# Patient Record
Sex: Female | Born: 1990
Health system: Southern US, Community
[De-identification: ages and names within clinical notes are randomized; demographics above are authoritative.]

## PROBLEM LIST (undated history)

## (undated) ENCOUNTER — Inpatient Hospital Stay (HOSPITAL_COMMUNITY): Payer: Self-pay

## (undated) DIAGNOSIS — F329 Major depressive disorder, single episode, unspecified: Secondary | ICD-10-CM

## (undated) DIAGNOSIS — A419 Sepsis, unspecified organism: Secondary | ICD-10-CM

## (undated) DIAGNOSIS — F419 Anxiety disorder, unspecified: Secondary | ICD-10-CM

## (undated) DIAGNOSIS — F32A Depression, unspecified: Secondary | ICD-10-CM

## (undated) HISTORY — PX: BREAST ENHANCEMENT SURGERY: SHX7

## (undated) HISTORY — DX: Major depressive disorder, single episode, unspecified: F32.9

## (undated) HISTORY — DX: Depression, unspecified: F32.A

---
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
## 2009-02-22 ENCOUNTER — Other Ambulatory Visit: Admission: RE | Admit: 2009-02-22 | Discharge: 2009-02-22 | Payer: Self-pay | Admitting: Family Medicine

## 2010-03-13 ENCOUNTER — Other Ambulatory Visit: Admission: RE | Admit: 2010-03-13 | Discharge: 2010-03-13 | Payer: Self-pay | Admitting: Family Medicine

## 2011-05-18 DIAGNOSIS — R599 Enlarged lymph nodes, unspecified: Secondary | ICD-10-CM | POA: Insufficient documentation

## 2011-05-18 DIAGNOSIS — R0602 Shortness of breath: Secondary | ICD-10-CM | POA: Insufficient documentation

## 2011-05-18 DIAGNOSIS — J111 Influenza due to unidentified influenza virus with other respiratory manifestations: Secondary | ICD-10-CM | POA: Insufficient documentation

## 2011-05-19 ENCOUNTER — Emergency Department (HOSPITAL_COMMUNITY)
Admission: EM | Admit: 2011-05-19 | Discharge: 2011-05-19 | Disposition: A | Discharge status: Home / Self Care | Payer: BC Managed Care – PPO | Attending: Emergency Medicine | Admitting: Emergency Medicine

## 2011-05-19 ENCOUNTER — Encounter: Payer: Self-pay | Admitting: *Deleted

## 2011-05-19 ENCOUNTER — Emergency Department (HOSPITAL_COMMUNITY): Payer: BC Managed Care – PPO

## 2011-05-19 DIAGNOSIS — J029 Acute pharyngitis, unspecified: Secondary | ICD-10-CM

## 2011-05-19 HISTORY — DX: Sepsis, unspecified organism: A41.9

## 2011-05-19 LAB — RAPID STREP SCREEN (MED CTR MEBANE ONLY): Streptococcus, Group A Screen (Direct): NEGATIVE

## 2011-05-19 MED ORDER — HYDROCOD POLST-CHLORPHEN POLST 10-8 MG/5ML PO LQCR
5.0000 mL | Freq: Two times a day (BID) | ORAL | Status: DC
Start: 2011-05-19 — End: 2016-11-29

## 2011-05-19 MED ORDER — ACETAMINOPHEN-CODEINE 120-12 MG/5ML PO SOLN
5.0000 mL | Freq: Once | ORAL | Status: AC
  Administered 2011-05-19: 5 mL via ORAL
  Filled 2011-05-19: qty 10

## 2011-05-19 MED ORDER — DEXAMETHASONE SODIUM PHOSPHATE 10 MG/ML IJ SOLN
10.0000 mg | Freq: Once | INTRAMUSCULAR | Status: AC
  Administered 2011-05-19: 10 mg via INTRAMUSCULAR
  Filled 2011-05-19: qty 1

## 2011-05-19 MED ORDER — ALBUTEROL SULFATE HFA 108 (90 BASE) MCG/ACT IN AERS
2.0000 | INHALATION_SPRAY | Freq: Once | RESPIRATORY_TRACT | Status: AC
  Administered 2011-05-19: 2 via RESPIRATORY_TRACT
  Filled 2011-05-19: qty 6.7

## 2011-05-19 NOTE — ED Notes (Signed)
Pt c/o cough, sore throat, weakness, fatigue. Pt has been taking keflex that she had left over for her symptoms w/o relief.

## 2011-05-19 NOTE — ED Notes (Signed)
MD at bedside. 

## 2011-05-19 NOTE — ED Notes (Signed)
Pt given Rx . Mother at b/s

## 2011-05-19 NOTE — ED Provider Notes (Signed)
History     CSN: [id number] Arrival date & time: 05/19/2011 12:36 AM   First MD Initiated Contact with Patient 05/19/11 0159      Chief Complaint  Patient presents with  . Cough  . Fatigue  . Sore Throat    (Consider location/radiation/quality/duration/timing/severity/associated sxs/prior treatment) HPI Comments: Patient here with a week history of headache, cough, sore throat, weakness and fatigue - states that she intiiallly thought she had a cold, treated with OTC medications - reports no improvement in symptoms - states now with much worsening sore throat.  Patient is a 20 y.o. female presenting with cough and pharyngitis. The history is provided by the patient. No language interpreter was used.  Cough This is a new problem. The current episode started more than 1 week ago. The problem occurs constantly. The problem has not changed since onset.The cough is productive of sputum. There has been no fever. Associated symptoms include ear congestion, ear pain, headaches, rhinorrhea, sore throat and myalgias. Pertinent negatives include no chest pain, no chills, no weight loss, no shortness of breath and no wheezing. She has tried nothing for the symptoms. The treatment provided no relief. She is not a smoker.  Sore Throat Associated symptoms include coughing, headaches, myalgias and a sore throat. Pertinent negatives include no chest pain or chills.    Past Medical History  Diagnosis Date  . Sepsis     x 2 as a child    History reviewed. No pertinent past surgical history.  History reviewed. No pertinent family history.  History  Substance Use Topics  . Smoking status: Never Smoker   . Smokeless tobacco: Not on file  . Alcohol Use: Yes     occasionally    OB History    Grav Para Term Preterm Abortions TAB SAB Ect Mult Living                  Review of Systems  Constitutional: Negative for chills and weight loss.  HENT: Positive for ear pain, sore throat and  rhinorrhea.   Respiratory: Positive for cough. Negative for shortness of breath and wheezing.   Cardiovascular: Negative for chest pain.  Musculoskeletal: Positive for myalgias.  Neurological: Positive for headaches.  All other systems reviewed and are negative.    Allergies  Review of patient's allergies indicates no known allergies.  Home Medications  No current outpatient prescriptions on file.  BP 138/92  Pulse 99  Temp(Src) 98.6 F (37 C) (Oral)  Resp 18  SpO2 99%  LMP 05/11/2011  Physical Exam  Nursing note and vitals reviewed. Constitutional: She is oriented to person, place, and time. She appears well-developed and well-nourished. No distress.  HENT:  Head: Normocephalic and atraumatic.  Right Ear: External ear normal.  Left Ear: No drainage. Decreased hearing is noted.  Nose: Mucosal edema and rhinorrhea present.  Mouth/Throat: Uvula is midline and mucous membranes are normal. Posterior oropharyngeal erythema present. No oropharyngeal exudate or tonsillar abscesses.  Eyes: Conjunctivae are normal. Pupils are equal, round, and reactive to light.  Neck: Normal range of motion. Neck supple.  Cardiovascular: Normal rate, regular rhythm and normal heart sounds.  Exam reveals no gallop and no friction rub.   No murmur heard. Pulmonary/Chest: Effort normal and breath sounds normal. No respiratory distress. She has no wheezes. She exhibits no tenderness.  Abdominal: Soft. Bowel sounds are normal. There is no tenderness.  Musculoskeletal: Normal range of motion.  Lymphadenopathy:    She has cervical adenopathy.  Neurological: She  is alert and oriented to person, place, and time.  Skin: Skin is warm and dry. No rash noted.  Psychiatric: She has a normal mood and affect. Her behavior is normal. Judgment and thought content normal.    ED Course  Procedures (including critical care time)   Labs Reviewed  RAPID STREP SCREEN  MONONUCLEOSIS SCREEN   Dg Chest 2  View  05/19/2011  *RADIOLOGY REPORT*  Clinical Data: Shortness of breath with cough and sore throat for 3 days.  CHEST - 2 VIEW  Comparison: None.  Findings: The heart size and mediastinal contours are normal. The lungs are clear. There is no pleural effusion or pneumothorax. No acute osseous findings are identified.  IMPRESSION: No active cardiopulmonary process.  Original Report Authenticated By: Gerrianne Scale, M.D.     Viral pharyngitis   MDM  Mono screen and strep negative - given decadron 10mg  IM and tylenol with codeine - she reports no improvement in pain.        Izola Price [place], [place] 05/19/11 [phone]

## 2011-05-20 NOTE — ED Provider Notes (Signed)
Medical screening examination/treatment/procedure(s) were performed by non-physician practitioner and as supervising physician I was immediately available for consultation/collaboration.  Mourad Cwikla R. Marcianne Ozbun, MD 05/20/11 1947 

## 2011-07-21 ENCOUNTER — Other Ambulatory Visit: Payer: Self-pay | Admitting: Family Medicine

## 2011-07-21 ENCOUNTER — Other Ambulatory Visit (HOSPITAL_COMMUNITY)
Admission: RE | Admit: 2011-07-21 | Discharge: 2011-07-21 | Disposition: A | Discharge status: Home / Self Care | Payer: BC Managed Care – PPO | Source: Ambulatory Visit | Attending: Family Medicine | Admitting: Family Medicine

## 2011-07-21 DIAGNOSIS — Z113 Encounter for screening for infections with a predominantly sexual mode of transmission: Secondary | ICD-10-CM | POA: Insufficient documentation

## 2011-07-21 DIAGNOSIS — Z0000 Encounter for general adult medical examination without abnormal findings: Secondary | ICD-10-CM | POA: Insufficient documentation

## 2012-11-15 ENCOUNTER — Other Ambulatory Visit: Payer: Self-pay | Admitting: Physician Assistant

## 2012-11-15 ENCOUNTER — Other Ambulatory Visit (HOSPITAL_COMMUNITY)
Admission: RE | Admit: 2012-11-15 | Discharge: 2012-11-15 | Disposition: A | Discharge status: Home / Self Care | Payer: BC Managed Care – PPO | Source: Ambulatory Visit | Attending: Family Medicine | Admitting: Family Medicine

## 2012-11-15 DIAGNOSIS — Z0000 Encounter for general adult medical examination without abnormal findings: Secondary | ICD-10-CM | POA: Insufficient documentation

## 2012-11-15 DIAGNOSIS — Z113 Encounter for screening for infections with a predominantly sexual mode of transmission: Secondary | ICD-10-CM | POA: Insufficient documentation

## 2016-11-28 ENCOUNTER — Inpatient Hospital Stay (HOSPITAL_COMMUNITY)
Admission: AD | Admit: 2016-11-28 | Discharge: 2016-11-29 | Disposition: A | Discharge status: Home / Self Care | Payer: 59 | Source: Ambulatory Visit | Attending: Obstetrics and Gynecology | Admitting: Obstetrics and Gynecology

## 2016-11-28 ENCOUNTER — Encounter (HOSPITAL_COMMUNITY): Payer: Self-pay | Admitting: *Deleted

## 2016-11-28 DIAGNOSIS — O26891 Other specified pregnancy related conditions, first trimester: Secondary | ICD-10-CM | POA: Diagnosis not present

## 2016-11-28 DIAGNOSIS — R109 Unspecified abdominal pain: Secondary | ICD-10-CM | POA: Diagnosis not present

## 2016-11-28 DIAGNOSIS — Z3A01 Less than 8 weeks gestation of pregnancy: Secondary | ICD-10-CM | POA: Insufficient documentation

## 2016-11-28 DIAGNOSIS — O3680X0 Pregnancy with inconclusive fetal viability, not applicable or unspecified: Secondary | ICD-10-CM

## 2016-11-28 DIAGNOSIS — O209 Hemorrhage in early pregnancy, unspecified: Secondary | ICD-10-CM | POA: Diagnosis present

## 2016-11-28 DIAGNOSIS — O9989 Other specified diseases and conditions complicating pregnancy, childbirth and the puerperium: Secondary | ICD-10-CM | POA: Diagnosis not present

## 2016-11-28 LAB — POCT PREGNANCY, URINE: PREG TEST UR: POSITIVE — AB

## 2016-11-28 NOTE — MAU Note (Signed)
LMp 10/12/16. Had positive upt about 3wks ago. Have first appt with Dr Elon SpannerLeger on 6/28. About 1600 had some cramping. Tonight went to BR and saw dark blood on panties. Cont to see dark blood on panties when I wipe.

## 2016-11-29 ENCOUNTER — Encounter (HOSPITAL_COMMUNITY): Payer: Self-pay | Admitting: Certified Nurse Midwife

## 2016-11-29 ENCOUNTER — Inpatient Hospital Stay (HOSPITAL_COMMUNITY): Payer: 59

## 2016-11-29 DIAGNOSIS — O9989 Other specified diseases and conditions complicating pregnancy, childbirth and the puerperium: Secondary | ICD-10-CM | POA: Diagnosis not present

## 2016-11-29 DIAGNOSIS — R109 Unspecified abdominal pain: Secondary | ICD-10-CM

## 2016-11-29 LAB — CBC
HCT: 37.8 % (ref 36.0–46.0)
Hemoglobin: 13.3 g/dL (ref 12.0–15.0)
MCH: 32.8 pg (ref 26.0–34.0)
MCHC: 35.2 g/dL (ref 30.0–36.0)
MCV: 93.3 fL (ref 78.0–100.0)
PLATELETS: 204 10*3/uL (ref 150–400)
RBC: 4.05 MIL/uL (ref 3.87–5.11)
RDW: 13 % (ref 11.5–15.5)
WBC: 9.8 10*3/uL (ref 4.0–10.5)

## 2016-11-29 LAB — WET PREP, GENITAL
Sperm: NONE SEEN
TRICH WET PREP: NONE SEEN
Yeast Wet Prep HPF POC: NONE SEEN

## 2016-11-29 LAB — URINALYSIS, ROUTINE W REFLEX MICROSCOPIC
Bilirubin Urine: NEGATIVE
Glucose, UA: NEGATIVE mg/dL
KETONES UR: NEGATIVE mg/dL
LEUKOCYTES UA: NEGATIVE
NITRITE: NEGATIVE
PROTEIN: NEGATIVE mg/dL
Specific Gravity, Urine: 1.01 (ref 1.005–1.030)
pH: 5.5 (ref 5.0–8.0)

## 2016-11-29 LAB — URINALYSIS, MICROSCOPIC (REFLEX)

## 2016-11-29 LAB — HCG, QUANTITATIVE, PREGNANCY: hCG, Beta Chain, Quant, S: 391 m[IU]/mL — ABNORMAL HIGH (ref <5)

## 2016-11-29 LAB — ABO/RH: ABO/RH(D): O POS

## 2016-11-29 LAB — HIV ANTIBODY (ROUTINE TESTING W REFLEX): HIV SCREEN 4TH GENERATION: NONREACTIVE

## 2016-11-29 NOTE — MAU Provider Note (Signed)
Chief Complaint: Vaginal Bleeding   First Provider Initiated Contact with Patient 11/29/16 0151      SUBJECTIVE HPI: Brooke Bailey is a 26 y.o. G1P0 at [redacted]w[redacted]d by LMP who presents to maternity admissions reporting onset of cramping and vaginal bleeding today. She reports she worked in the yard, then vacuumed the house, and then started having low abdominal intermittent cramping like period cramps. She thought she may have overdone it today and just needed to rest, but then she saw dark red bleeding when wiping and some on the pad she put on before arriving in MAU. The bleeding is like a light period and is unchanged since onset.  She has not tried any treatments for pain or bleeding, nothing makes them better or worse.  There are no associated symptoms.   She denies vaginal itching/burning, urinary symptoms, h/a, dizziness, n/v, or fever/chills.     HPI  Past Medical History:  Diagnosis Date  . Sepsis(995.91)    x 2 as a child   History reviewed. No pertinent surgical history. Social History   Social History  . Marital status: Married    Spouse name: N/A  . Number of children: N/A  . Years of education: N/A   Occupational History  . Not on file.   Social History Main Topics  . Smoking status: Never Smoker  . Smokeless tobacco: Not on file  . Alcohol use Yes     Comment: occasionally  . Drug use: No  . Sexual activity: Yes    Birth control/ protection: Pill   Other Topics Concern  . Not on file   Social History Narrative  . No narrative on file   No current facility-administered medications on file prior to encounter.    No current outpatient prescriptions on file prior to encounter.   No Known Allergies  ROS:  Review of Systems  Constitutional: Negative for chills, fatigue and fever.  Respiratory: Negative for shortness of breath.   Cardiovascular: Negative for chest pain.  Gastrointestinal: Negative for nausea and vomiting.  Genitourinary: Positive for pelvic  pain and vaginal bleeding. Negative for difficulty urinating, dysuria, flank pain, vaginal discharge and vaginal pain.  Neurological: Negative for dizziness and headaches.  Psychiatric/Behavioral: Negative.      I have reviewed patient's Past Medical Hx, Surgical Hx, Family Hx, Social Hx, medications and allergies.   Physical Exam   Patient Vitals for the past 24 hrs:  BP Temp Temp src Pulse Resp Height Weight  11/29/16 0406 129/75 98.6 F (37 C) Oral 92 16 - -  11/29/16 0129 (!) 144/90 98.2 F (36.8 C) Oral (!) 104 16 - -  11/28/16 2312 (!) 151/87 98.3 F (36.8 C) - (!) 106 18 5\' 8"  (1.727 m) 170 lb (77.1 kg)   Constitutional: Well-developed, well-nourished female in no acute distress.  Cardiovascular: normal rate Respiratory: normal effort GI: Abd soft, non-tender. Pos BS x 4 MS: Extremities nontender, no edema, normal ROM Neurologic: Alert and oriented x 4.  GU: Neg CVAT.  PELVIC EXAM: Cervix pink, visually closed, without lesion, small amount dark red bleeding cleared from vagina with 1 large cotton swab, vaginal walls and external genitalia normal Bimanual exam: Cervix 0/long/high, firm, anterior, neg CMT, uterus nontender, slightly enlarged, adnexa without tenderness, enlargement, or mass   LAB RESULTS Results for orders placed or performed during the hospital encounter of 11/28/16 (from the past 24 hour(s))  Pregnancy, urine POC     Status: Abnormal   Collection Time: 11/28/16 11:27 PM  Result Value Ref Range   Preg Test, Ur POSITIVE (A) NEGATIVE  Urinalysis, Routine w reflex microscopic     Status: Abnormal   Collection Time: 11/29/16  1:20 AM  Result Value Ref Range   Color, Urine YELLOW YELLOW   APPearance CLEAR CLEAR   Specific Gravity, Urine 1.010 1.005 - 1.030   pH 5.5 5.0 - 8.0   Glucose, UA NEGATIVE NEGATIVE mg/dL   Hgb urine dipstick MODERATE (A) NEGATIVE   Bilirubin Urine NEGATIVE NEGATIVE   Ketones, ur NEGATIVE NEGATIVE mg/dL   Protein, ur NEGATIVE  NEGATIVE mg/dL   Nitrite NEGATIVE NEGATIVE   Leukocytes, UA NEGATIVE NEGATIVE  Urinalysis, Microscopic (reflex)     Status: Abnormal   Collection Time: 11/29/16  1:20 AM  Result Value Ref Range   RBC / HPF 0-5 0 - 5 RBC/hpf   WBC, UA 0-5 0 - 5 WBC/hpf   Bacteria, UA FEW (A) NONE SEEN   Squamous Epithelial / LPF 0-5 (A) NONE SEEN  CBC     Status: None   Collection Time: 11/29/16  1:31 AM  Result Value Ref Range   WBC 9.8 4.0 - 10.5 K/uL   RBC 4.05 3.87 - 5.11 MIL/uL   Hemoglobin 13.3 12.0 - 15.0 g/dL   HCT 45.437.8 09.836.0 - 11.946.0 %   MCV 93.3 78.0 - 100.0 fL   MCH 32.8 26.0 - 34.0 pg   MCHC 35.2 30.0 - 36.0 g/dL   RDW 14.713.0 82.911.5 - 56.215.5 %   Platelets 204 150 - 400 K/uL  hCG, quantitative, pregnancy     Status: Abnormal   Collection Time: 11/29/16  1:31 AM  Result Value Ref Range   hCG, Beta Chain, Quant, S 391 (H) <5 mIU/mL  HIV antibody     Status: None   Collection Time: 11/29/16  1:31 AM  Result Value Ref Range   HIV Screen 4th Generation wRfx Non Reactive Non Reactive  ABO/Rh     Status: None   Collection Time: 11/29/16  1:31 AM  Result Value Ref Range   ABO/RH(D) O POS   Wet prep, genital     Status: Abnormal   Collection Time: 11/29/16  2:00 AM  Result Value Ref Range   Yeast Wet Prep HPF POC NONE SEEN NONE SEEN   Trich, Wet Prep NONE SEEN NONE SEEN   Clue Cells Wet Prep HPF POC PRESENT (A) NONE SEEN   WBC, Wet Prep HPF POC MODERATE (A) NONE SEEN   Sperm NONE SEEN     --/--/O POS (06/16 0131)  IMAGING [place] Ob Comp Less 14 Wks  Result Date: 11/29/2016 CLINICAL DATA:  Cramping and bleeding EXAM: OBSTETRIC <14 WK US AND TRANSVAGINAL OB US TECHNIQUE: Both transabdominal and transvaginal ultrasound examinations were performed for complete evaluation of the gestation as well as the maternal uterus, adnexal regions, and pelvic cul-de-sac. Transvaginal technique was performed to assess early pregnancy. COMPARISON:  None. FINDINGS: Intrauterine gestational sac: Probable intrauterine  gestational sac Yolk sac:  Possible small yolk sac Embryo:  Not visualized MSD: 3.4  mm   5 w   0  d Subchorionic hemorrhage:  None visualized. Maternal uterus/adnexae: Ovaries are within normal limits. The right ovary measures 2.6 by 3 x 1.6 cm and the left ovary measures 3.1 by 3.1 x 2 cm. Trace free fluid. IMPRESSION: Probable early intrauterine gestational sac, possible small yolk sac, but no fetal pole, or cardiac activity yet visualized. Recommend follow-up quantitative B-HCG levels and follow-up US in 14 days  to assess viability. This recommendation follows SRU consensus guidelines: Diagnostic Criteria for Nonviable Pregnancy Early in the First Trimester. Malva Limes Med 2013; 191:4782-95. Trace free fluid Electronically Signed   By: Jasmine Pang M.D.   On: 11/29/2016 03:18   US Ob Transvaginal  Result Date: 11/29/2016 CLINICAL DATA:  Cramping and bleeding EXAM: OBSTETRIC <14 WK [place] AND TRANSVAGINAL OB US TECHNIQUE: Both transabdominal and transvaginal ultrasound examinations were performed for complete evaluation of the gestation as well as the maternal uterus, adnexal regions, and pelvic cul-de-sac. Transvaginal technique was performed to assess early pregnancy. COMPARISON:  None. FINDINGS: Intrauterine gestational sac: Probable intrauterine gestational sac Yolk sac:  Possible small yolk sac Embryo:  Not visualized MSD: 3.4  mm   5 w   0  d Subchorionic hemorrhage:  None visualized. Maternal uterus/adnexae: Ovaries are within normal limits. The right ovary measures 2.6 by 3 x 1.6 cm and the left ovary measures 3.1 by 3.1 x 2 cm. Trace free fluid. IMPRESSION: Probable early intrauterine gestational sac, possible small yolk sac, but no fetal pole, or cardiac activity yet visualized. Recommend follow-up quantitative B-HCG levels and follow-up US in 14 days to assess viability. This recommendation follows SRU consensus guidelines: Diagnostic Criteria for Nonviable Pregnancy Early in the First Trimester. Malva Limes Med 2013; 621:3086-57. Trace free fluid Electronically Signed   By: Jasmine Pang M.D.   On: 11/29/2016 03:18    MAU Management/MDM: Ordered labs and [place] and reviewed results. Consult Dr Marcelle Overlie with assessment and findings. Findings today could represent a normal early pregnancy, spontaneous abortion or ectopic pregnancy which can be life-threatening.  Ectopic precautions were given to the patient with plan to return in 48 hours for repeat quant hcg to evaluate pregnancy development. Pt stable at time of discharge.  ASSESSMENT 1. Pregnancy of unknown anatomic location   2. Vaginal bleeding in pregnancy, first trimester   3. Abdominal pain during pregnancy in first trimester     PLAN Discharge home Return on Sunday evening for repeat quant hcg or sooner as needed for emergencies  Allergies as of 11/29/2016   No Known Allergies     Medication List    STOP taking these medications   chlorpheniramine-HYDROcodone 10-8 MG/5ML Lqcr Commonly known as:  TUSSIONEX PENNKINETIC ER     TAKE these medications   citalopram 20 MG tablet Commonly known as:  CELEXA Take 20 mg by mouth daily.      Follow-up Information    [facility] [place] Follow up.   Why:  Return to MAU on Sunday, 11/30/16, after 5 pm for repeat labwork or return sooner as needed for emergencies. Contact information: [address] [place] [phone]          Sharen Counter Certified Nurse-Midwife 11/29/2016  8:14 PM

## 2016-11-30 ENCOUNTER — Encounter (HOSPITAL_COMMUNITY): Payer: Self-pay | Admitting: *Deleted

## 2016-11-30 ENCOUNTER — Inpatient Hospital Stay (HOSPITAL_COMMUNITY)
Admission: AD | Admit: 2016-11-30 | Discharge: 2016-11-30 | Disposition: A | Discharge status: Home / Self Care | Payer: 59 | Source: Ambulatory Visit | Attending: Obstetrics and Gynecology | Admitting: Obstetrics and Gynecology

## 2016-11-30 DIAGNOSIS — Z3A01 Less than 8 weeks gestation of pregnancy: Secondary | ICD-10-CM | POA: Insufficient documentation

## 2016-11-30 DIAGNOSIS — O039 Complete or unspecified spontaneous abortion without complication: Secondary | ICD-10-CM | POA: Insufficient documentation

## 2016-11-30 HISTORY — DX: Anxiety disorder, unspecified: F41.9

## 2016-11-30 LAB — HCG, QUANTITATIVE, PREGNANCY: hCG, Beta Chain, Quant, S: 152 m[IU]/mL — ABNORMAL HIGH (ref <5)

## 2016-11-30 NOTE — MAU Provider Note (Signed)
S:  [name] is a 26 y.o. female G1P0 @ 469w0d here in MAU for a follow up Quant. She was seen 2 days ago with vaginal bleeding and pain, here in MAU. Over the weekend she continued to have bleeding and pain and had an episode of increased pain/bleeding and then it stopped. Currently she is without pain, the bleeding is light.    O:  GENERAL: Well-developed, well-nourished female in no acute distress.  LUNGS: Effort normal SKIN: Warm, dry and without erythema PSYCH: Normal mood and affect  Vitals:   11/30/16 1836 11/30/16 1856  BP: (!) 155/82 133/82  Pulse: (!) 107 98  Resp: 16 16  Temp: 98 F (36.7 C)   TempSrc: Oral     MDM:  Quant 6/15: 391 Quant 6/17: 152  O positive blood type Discussed patient with Dr. Marcelle OverlieHolland.    A:  1. SAB (spontaneous abortion)      P:  Discharge home in stable condition Patient to return to MAU if symptoms worsen Patient to call the office tomorrow to schedule a follow up quant Support given.     Duane Lopeasch, Analilia Geddis I, NP 11/30/2016 6:23 PM

## 2016-11-30 NOTE — Discharge Instructions (Signed)

## 2016-11-30 NOTE — MAU Note (Signed)
Pt was here two days ago with vag bleeding and cramping that continued throughout the weekend, noticed mucous and clots this morning, after that the cramping slowed as well as the bleeding. Pt is pain free now. Here for a repeat HCG.

## 2016-12-01 LAB — GC/CHLAMYDIA PROBE AMP (~~LOC~~) NOT AT ARMC
Chlamydia: NEGATIVE
Neisseria Gonorrhea: NEGATIVE

## 2017-01-14 DIAGNOSIS — Z01419 Encounter for gynecological examination (general) (routine) without abnormal findings: Secondary | ICD-10-CM | POA: Diagnosis not present

## 2017-05-25 DIAGNOSIS — R0600 Dyspnea, unspecified: Secondary | ICD-10-CM | POA: Diagnosis not present

## 2017-05-25 DIAGNOSIS — J984 Other disorders of lung: Secondary | ICD-10-CM | POA: Diagnosis not present

## 2017-05-25 DIAGNOSIS — R05 Cough: Secondary | ICD-10-CM | POA: Diagnosis not present

## 2017-05-25 DIAGNOSIS — R918 Other nonspecific abnormal finding of lung field: Secondary | ICD-10-CM | POA: Diagnosis not present

## 2017-05-25 DIAGNOSIS — R002 Palpitations: Secondary | ICD-10-CM | POA: Diagnosis not present

## 2017-05-25 DIAGNOSIS — Z79899 Other long term (current) drug therapy: Secondary | ICD-10-CM | POA: Diagnosis not present

## 2017-05-25 DIAGNOSIS — M791 Myalgia, unspecified site: Secondary | ICD-10-CM | POA: Diagnosis not present

## 2017-05-25 DIAGNOSIS — J181 Lobar pneumonia, unspecified organism: Secondary | ICD-10-CM | POA: Diagnosis not present

## 2017-05-25 DIAGNOSIS — J168 Pneumonia due to other specified infectious organisms: Secondary | ICD-10-CM | POA: Diagnosis not present

## 2017-06-16 NOTE — L&D Delivery Note (Signed)
Delivery Note At 2:06 PM a viable female was delivered via  (Presentation: LOA). APGAR: 8, 9; weight pending.  Placenta status: S, I. 3V Cord with the following complications: none.  Cord pH: n/a  Anesthesia:  CLEA Episiotomy:  n/a Lacerations:  2nd degree Suture Repair: 3.0 vicryl rapide Est. Blood Loss (mL):  350  Mom to postpartum.  Baby to Couplet care / Skin to Skin.  Brooke Bailey 06/04/2018, 2:36 PM

## 2017-10-05 DIAGNOSIS — N912 Amenorrhea, unspecified: Secondary | ICD-10-CM | POA: Diagnosis not present

## 2017-10-07 DIAGNOSIS — N912 Amenorrhea, unspecified: Secondary | ICD-10-CM | POA: Diagnosis not present

## 2017-10-11 ENCOUNTER — Encounter (HOSPITAL_COMMUNITY): Payer: Self-pay

## 2017-10-20 DIAGNOSIS — N911 Secondary amenorrhea: Secondary | ICD-10-CM | POA: Diagnosis not present

## 2017-10-28 DIAGNOSIS — Z3685 Encounter for antenatal screening for Streptococcus B: Secondary | ICD-10-CM | POA: Diagnosis not present

## 2017-10-28 DIAGNOSIS — Z3401 Encounter for supervision of normal first pregnancy, first trimester: Secondary | ICD-10-CM | POA: Diagnosis not present

## 2017-10-28 DIAGNOSIS — Z13228 Encounter for screening for other metabolic disorders: Secondary | ICD-10-CM | POA: Diagnosis not present

## 2017-10-28 LAB — OB RESULTS CONSOLE HIV ANTIBODY (ROUTINE TESTING): HIV: NONREACTIVE

## 2017-10-28 LAB — OB RESULTS CONSOLE ANTIBODY SCREEN: ANTIBODY SCREEN: NEGATIVE

## 2017-10-28 LAB — OB RESULTS CONSOLE GC/CHLAMYDIA
CHLAMYDIA, DNA PROBE: NEGATIVE
GC PROBE AMP, GENITAL: NEGATIVE

## 2017-10-28 LAB — OB RESULTS CONSOLE ABO/RH: RH Type: POSITIVE

## 2017-10-28 LAB — OB RESULTS CONSOLE RUBELLA ANTIBODY, IGM: Rubella: IMMUNE

## 2017-10-28 LAB — OB RESULTS CONSOLE HEPATITIS B SURFACE ANTIGEN: HEP B S AG: NEGATIVE

## 2017-10-28 LAB — OB RESULTS CONSOLE RPR: RPR: NONREACTIVE

## 2017-12-01 DIAGNOSIS — Z3A12 12 weeks gestation of pregnancy: Secondary | ICD-10-CM | POA: Diagnosis not present

## 2018-01-19 DIAGNOSIS — Z3480 Encounter for supervision of other normal pregnancy, unspecified trimester: Secondary | ICD-10-CM | POA: Diagnosis not present

## 2018-01-19 DIAGNOSIS — Z3A19 19 weeks gestation of pregnancy: Secondary | ICD-10-CM | POA: Diagnosis not present

## 2018-03-10 DIAGNOSIS — Z3480 Encounter for supervision of other normal pregnancy, unspecified trimester: Secondary | ICD-10-CM | POA: Diagnosis not present

## 2018-03-24 DIAGNOSIS — D509 Iron deficiency anemia, unspecified: Secondary | ICD-10-CM | POA: Diagnosis not present

## 2018-03-26 ENCOUNTER — Other Ambulatory Visit (HOSPITAL_COMMUNITY): Payer: Self-pay

## 2018-03-29 ENCOUNTER — Ambulatory Visit (HOSPITAL_COMMUNITY)
Admission: RE | Admit: 2018-03-29 | Discharge: 2018-03-29 | Disposition: A | Discharge status: Home / Self Care | Payer: 59 | Source: Ambulatory Visit | Attending: Obstetrics and Gynecology | Admitting: Obstetrics and Gynecology

## 2018-03-29 DIAGNOSIS — O99019 Anemia complicating pregnancy, unspecified trimester: Secondary | ICD-10-CM | POA: Insufficient documentation

## 2018-03-29 DIAGNOSIS — Z3A00 Weeks of gestation of pregnancy not specified: Secondary | ICD-10-CM | POA: Diagnosis not present

## 2018-03-29 DIAGNOSIS — D509 Iron deficiency anemia, unspecified: Secondary | ICD-10-CM | POA: Diagnosis not present

## 2018-03-29 MED ORDER — SODIUM CHLORIDE 0.9 % IV SOLN
510.0000 mg | INTRAVENOUS | Status: DC
  Administered 2018-03-29: 510 mg via INTRAVENOUS
  Filled 2018-03-29: qty 17

## 2018-03-29 NOTE — Discharge Instructions (Signed)

## 2018-04-01 ENCOUNTER — Encounter (HOSPITAL_COMMUNITY)
Admission: RE | Admit: 2018-04-01 | Discharge: 2018-04-01 | Disposition: A | Discharge status: Home / Self Care | Payer: 59 | Source: Ambulatory Visit | Attending: Obstetrics and Gynecology | Admitting: Obstetrics and Gynecology

## 2018-04-01 DIAGNOSIS — D509 Iron deficiency anemia, unspecified: Secondary | ICD-10-CM | POA: Diagnosis present

## 2018-04-01 MED ORDER — SODIUM CHLORIDE 0.9 % IV SOLN
510.0000 mg | INTRAVENOUS | Status: DC
  Administered 2018-04-01: 510 mg via INTRAVENOUS
  Filled 2018-04-01: qty 17

## 2018-04-01 MED ORDER — SODIUM CHLORIDE 0.9 % IV SOLN
510.0000 mg | INTRAVENOUS | Status: DC
  Filled 2018-04-01: qty 17

## 2018-04-27 DIAGNOSIS — Z3482 Encounter for supervision of other normal pregnancy, second trimester: Secondary | ICD-10-CM | POA: Diagnosis not present

## 2018-04-27 DIAGNOSIS — Z3483 Encounter for supervision of other normal pregnancy, third trimester: Secondary | ICD-10-CM | POA: Diagnosis not present

## 2018-05-06 DIAGNOSIS — Z3480 Encounter for supervision of other normal pregnancy, unspecified trimester: Secondary | ICD-10-CM | POA: Diagnosis not present

## 2018-05-06 LAB — OB RESULTS CONSOLE GBS: GBS: NEGATIVE

## 2018-05-25 ENCOUNTER — Encounter (HOSPITAL_COMMUNITY): Payer: Self-pay | Admitting: *Deleted

## 2018-05-25 ENCOUNTER — Telehealth (HOSPITAL_COMMUNITY): Payer: Self-pay | Admitting: *Deleted

## 2018-05-25 NOTE — Telephone Encounter (Signed)
Preadmission screen  

## 2018-06-03 ENCOUNTER — Encounter (HOSPITAL_COMMUNITY): Payer: Self-pay

## 2018-06-03 ENCOUNTER — Other Ambulatory Visit: Payer: Self-pay

## 2018-06-03 ENCOUNTER — Inpatient Hospital Stay (HOSPITAL_COMMUNITY)
Admission: RE | Admit: 2018-06-03 | Discharge: 2018-06-05 | DRG: 807 | Disposition: A | Discharge status: Home / Self Care | Payer: 59 | Attending: Obstetrics & Gynecology | Admitting: Obstetrics & Gynecology

## 2018-06-03 DIAGNOSIS — Z3A39 39 weeks gestation of pregnancy: Secondary | ICD-10-CM

## 2018-06-03 DIAGNOSIS — Z3490 Encounter for supervision of normal pregnancy, unspecified, unspecified trimester: Secondary | ICD-10-CM

## 2018-06-03 DIAGNOSIS — Z3483 Encounter for supervision of other normal pregnancy, third trimester: Secondary | ICD-10-CM | POA: Diagnosis not present

## 2018-06-03 LAB — CBC
HCT: 36.1 % (ref 36.0–46.0)
Hemoglobin: 12.7 g/dL (ref 12.0–15.0)
MCH: 33.8 pg (ref 26.0–34.0)
MCHC: 35.2 g/dL (ref 30.0–36.0)
MCV: 96 fL (ref 80.0–100.0)
Platelets: 167 10*3/uL (ref 150–400)
RBC: 3.76 MIL/uL — AB (ref 3.87–5.11)
RDW: 14.1 % (ref 11.5–15.5)
WBC: 9.1 10*3/uL (ref 4.0–10.5)
nRBC: 0 % (ref 0.0–0.2)

## 2018-06-03 LAB — RPR: RPR Ser Ql: NONREACTIVE

## 2018-06-03 LAB — TYPE AND SCREEN
ABO/RH(D): O POS
Antibody Screen: NEGATIVE

## 2018-06-03 MED ORDER — TERBUTALINE SULFATE 1 MG/ML IJ SOLN
0.2500 mg | Freq: Once | INTRAMUSCULAR | Status: DC | PRN
  Filled 2018-06-03: qty 1

## 2018-06-03 MED ORDER — PHENYLEPHRINE 40 MCG/ML (10ML) SYRINGE FOR IV PUSH (FOR BLOOD PRESSURE SUPPORT)
80.0000 ug | PREFILLED_SYRINGE | INTRAVENOUS | Status: DC | PRN
  Filled 2018-06-03 (×2): qty 10

## 2018-06-03 MED ORDER — BUTORPHANOL TARTRATE 2 MG/ML IJ SOLN
2.0000 mg | INTRAMUSCULAR | Status: DC | PRN
  Administered 2018-06-03 – 2018-06-04 (×4): 2 mg via INTRAVENOUS
  Filled 2018-06-03 (×4): qty 2

## 2018-06-03 MED ORDER — OXYCODONE-ACETAMINOPHEN 5-325 MG PO TABS: 1.0000 | ORAL_TABLET | ORAL | Status: DC | PRN

## 2018-06-03 MED ORDER — FENTANYL 2.5 MCG/ML BUPIVACAINE 1/10 % EPIDURAL INFUSION (WH - ANES)
14.0000 mL/h | INTRAMUSCULAR | Status: DC | PRN
  Administered 2018-06-04: 14 mL/h via EPIDURAL
  Filled 2018-06-03 (×2): qty 100

## 2018-06-03 MED ORDER — LACTATED RINGERS IV SOLN
INTRAVENOUS | Status: DC
  Administered 2018-06-03 – 2018-06-04 (×5): via INTRAVENOUS

## 2018-06-03 MED ORDER — EPHEDRINE 5 MG/ML INJ
10.0000 mg | INTRAVENOUS | Status: DC | PRN
  Filled 2018-06-03: qty 2

## 2018-06-03 MED ORDER — LIDOCAINE HCL (PF) 1 % IJ SOLN
30.0000 mL | INTRAMUSCULAR | Status: DC | PRN
  Filled 2018-06-03: qty 30

## 2018-06-03 MED ORDER — OXYCODONE-ACETAMINOPHEN 5-325 MG PO TABS: 2.0000 | ORAL_TABLET | ORAL | Status: DC | PRN

## 2018-06-03 MED ORDER — OXYTOCIN 40 UNITS IN LACTATED RINGERS INFUSION - SIMPLE MED: 1.0000 m[IU]/min | INTRAVENOUS | Status: DC

## 2018-06-03 MED ORDER — ONDANSETRON HCL 4 MG/2ML IJ SOLN: 4.0000 mg | Freq: Four times a day (QID) | INTRAMUSCULAR | Status: DC | PRN

## 2018-06-03 MED ORDER — FLEET ENEMA 7-19 GM/118ML RE ENEM: 1.0000 | ENEMA | RECTAL | Status: DC | PRN

## 2018-06-03 MED ORDER — LACTATED RINGERS IV SOLN
500.0000 mL | Freq: Once | INTRAVENOUS | Status: AC
  Administered 2018-06-04: 500 mL via INTRAVENOUS

## 2018-06-03 MED ORDER — LACTATED RINGERS IV SOLN: 500.0000 mL | INTRAVENOUS | Status: DC | PRN

## 2018-06-03 MED ORDER — OXYTOCIN 40 UNITS IN LACTATED RINGERS INFUSION - SIMPLE MED
2.5000 [IU]/h | INTRAVENOUS | Status: DC
  Administered 2018-06-04: 2.5 [IU]/h via INTRAVENOUS
  Filled 2018-06-03: qty 1000

## 2018-06-03 MED ORDER — OXYTOCIN 40 UNITS IN LACTATED RINGERS INFUSION - SIMPLE MED
1.0000 m[IU]/min | INTRAVENOUS | Status: DC
  Administered 2018-06-03: 1 m[IU]/min via INTRAVENOUS

## 2018-06-03 MED ORDER — OXYTOCIN BOLUS FROM INFUSION
500.0000 mL | Freq: Once | INTRAVENOUS | Status: AC
  Administered 2018-06-04: 500 mL via INTRAVENOUS

## 2018-06-03 MED ORDER — PHENYLEPHRINE 40 MCG/ML (10ML) SYRINGE FOR IV PUSH (FOR BLOOD PRESSURE SUPPORT)
80.0000 ug | PREFILLED_SYRINGE | INTRAVENOUS | Status: DC | PRN
  Filled 2018-06-03: qty 10

## 2018-06-03 MED ORDER — ACETAMINOPHEN 325 MG PO TABS: 650.0000 mg | ORAL_TABLET | ORAL | Status: DC | PRN

## 2018-06-03 MED ORDER — MISOPROSTOL 25 MCG QUARTER TABLET
25.0000 ug | ORAL_TABLET | ORAL | Status: DC | PRN
  Administered 2018-06-03 (×3): 25 ug via VAGINAL
  Filled 2018-06-03 (×4): qty 1

## 2018-06-03 MED ORDER — SOD CITRATE-CITRIC ACID 500-334 MG/5ML PO SOLN: 30.0000 mL | ORAL | Status: DC | PRN

## 2018-06-03 MED ORDER — DIPHENHYDRAMINE HCL 50 MG/ML IJ SOLN: 12.5000 mg | INTRAMUSCULAR | Status: DC | PRN

## 2018-06-03 NOTE — Progress Notes (Signed)
Patient has received 3 cytotec. Feels cramping FHR Category 1 Toco uterine contractions every 2 to 3 minutes Cervix is 50% 1 cm -2 Very posterior  Will let her shower and have dinner I am hopeful we can start some pitocin after that if contractions space

## 2018-06-03 NOTE — H&P (Signed)
Brooke SheriffCassandra Bailey is a 27 year old G 2 P 0 at 39 weeks admitted for IOL secondary to personal reasons. Admitted last night and given 2 cytotec. OB History    Gravida  2   Para      Term      Preterm      AB  1   Living        SAB  1   TAB      Ectopic      Multiple      Live Births             Past Medical History:  Diagnosis Date  . Anxiety   . Depression   . Sepsis(995.91)    x 2 as a child   Past Surgical History:  Procedure Laterality Date  . BREAST ENHANCEMENT SURGERY     Family History: family history includes Cancer in her maternal grandmother and paternal grandmother; Heart disease in her maternal grandmother; Hypertension in her mother; Stroke in her maternal grandmother. Social History:  reports that she has never smoked. She has never used smokeless tobacco. She reports current alcohol use. She reports that she does not use drugs.     Maternal Diabetes: No Genetic Screening: Normal Maternal Ultrasounds/Referrals: Normal Fetal Ultrasounds or other Referrals:  None Maternal Substance Abuse:  No Significant Maternal Medications:  None Significant Maternal Lab Results:  None Other Comments:  None  Review of Systems  All other systems reviewed and are negative.  History Dilation: Fingertip Effacement (%): Thick Station: -3 Exam by:: Dr. Vincente PoliGrewal Blood pressure 122/74, pulse 93, temperature 97.9 F (36.6 C), temperature source Oral, resp. rate 18, height 5\' 8"  (1.727 m), weight 96.5 kg, unknown if currently breastfeeding. Exam Physical Exam  Nursing note and vitals reviewed. Constitutional: She appears well-developed and well-nourished.  Eyes: Pupils are equal, round, and reactive to light.  Neck: Normal range of motion.  Cardiovascular: Normal rate and regular rhythm.  Respiratory: Effort normal.    Prenatal labs: ABO, Rh: --/--/O POS (12/19 16100116) Antibody: NEG (12/19 0116) Rubella: Immune (05/15 0000) RPR: Nonreactive (05/15 0000)   HBsAg: Negative (05/15 0000)  HIV: Non-reactive (05/15 0000)  GBS:     Assessment/Plan: IUP at 39 weeks IOL Unfavorable cervix Patient definitely needs more cervical ripening will give 2 more cytotec and then reevaluate  [name] 06/03/2018, 8:01 AM

## 2018-06-03 NOTE — Anesthesia Pain Management Evaluation Note (Signed)
  CRNA Pain Management Visit Note  Patient: Brooke Bailey, 27 y.o., female  "Hello [facility] am [facility]. We have an anesthesia team available at all times to provide care throughout the hospital, including epidural management and anesthesia for C-section. I don't know your plan for the delivery whether it a natural birth, water birth, IV sedation, nitrous supplementation, doula or epidural, but we want to meet your pain goals."   1.Was your pain managed to your expectations on prior hospitalizations?   No prior hospitalizations  2.What is your expectation for pain management during this hospitalization?     Epidural  3.How can we help you reach that goal?   Record the patient's initial score and the patient's pain goal.   Pain: 3  Pain Goal: 6 [facility] wants you to be able to say your pain was always managed very well.  Brooke Bailey,Brooke Bailey 06/03/2018

## 2018-06-04 ENCOUNTER — Inpatient Hospital Stay (HOSPITAL_COMMUNITY): Payer: 59 | Admitting: Anesthesiology

## 2018-06-04 ENCOUNTER — Encounter (HOSPITAL_COMMUNITY): Payer: Self-pay

## 2018-06-04 LAB — RUBELLA SCREEN: Rubella: 2.69 index (ref >0.99)

## 2018-06-04 MED ORDER — COCONUT OIL OIL: 1.0000 "application " | TOPICAL_OIL | Status: DC | PRN

## 2018-06-04 MED ORDER — ONDANSETRON HCL 4 MG/2ML IJ SOLN: 4.0000 mg | INTRAMUSCULAR | Status: DC | PRN

## 2018-06-04 MED ORDER — IBUPROFEN 600 MG PO TABS
600.0000 mg | ORAL_TABLET | Freq: Four times a day (QID) | ORAL | Status: DC
  Administered 2018-06-04 – 2018-06-05 (×5): 600 mg via ORAL
  Filled 2018-06-04 (×5): qty 1

## 2018-06-04 MED ORDER — WITCH HAZEL-GLYCERIN EX PADS: 1.0000 "application " | MEDICATED_PAD | CUTANEOUS | Status: DC | PRN

## 2018-06-04 MED ORDER — OXYCODONE-ACETAMINOPHEN 5-325 MG PO TABS
1.0000 | ORAL_TABLET | ORAL | Status: DC | PRN
  Administered 2018-06-04 – 2018-06-05 (×2): 1 via ORAL
  Filled 2018-06-04 (×3): qty 1

## 2018-06-04 MED ORDER — LACTATED RINGERS IV SOLN: 500.0000 mL | Freq: Once | INTRAVENOUS | Status: DC

## 2018-06-04 MED ORDER — PRENATAL MULTIVITAMIN CH
1.0000 | ORAL_TABLET | Freq: Every day | ORAL | Status: DC
  Administered 2018-06-05: 1 via ORAL
  Filled 2018-06-04: qty 1

## 2018-06-04 MED ORDER — LIDOCAINE HCL (PF) 1 % IJ SOLN
INTRAMUSCULAR | Status: DC | PRN
  Administered 2018-06-04 (×2): 5 mL via EPIDURAL

## 2018-06-04 MED ORDER — DIBUCAINE 1 % RE OINT: 1.0000 "application " | TOPICAL_OINTMENT | RECTAL | Status: DC | PRN

## 2018-06-04 MED ORDER — TETANUS-DIPHTH-ACELL PERTUSSIS 5-2.5-18.5 LF-MCG/0.5 IM SUSP: 0.5000 mL | Freq: Once | INTRAMUSCULAR | Status: DC

## 2018-06-04 MED ORDER — DIPHENHYDRAMINE HCL 25 MG PO CAPS: 25.0000 mg | ORAL_CAPSULE | Freq: Four times a day (QID) | ORAL | Status: DC | PRN

## 2018-06-04 MED ORDER — ONDANSETRON HCL 4 MG PO TABS: 4.0000 mg | ORAL_TABLET | ORAL | Status: DC | PRN

## 2018-06-04 MED ORDER — SIMETHICONE 80 MG PO CHEW: 80.0000 mg | CHEWABLE_TABLET | ORAL | Status: DC | PRN

## 2018-06-04 MED ORDER — SENNOSIDES-DOCUSATE SODIUM 8.6-50 MG PO TABS
2.0000 | ORAL_TABLET | ORAL | Status: DC
  Administered 2018-06-05: 2 via ORAL
  Filled 2018-06-04: qty 2

## 2018-06-04 MED ORDER — CITALOPRAM HYDROBROMIDE 20 MG PO TABS
20.0000 mg | ORAL_TABLET | Freq: Every day | ORAL | Status: DC
  Administered 2018-06-05: 20 mg via ORAL
  Filled 2018-06-04 (×3): qty 1

## 2018-06-04 MED ORDER — OXYCODONE-ACETAMINOPHEN 5-325 MG PO TABS: 2.0000 | ORAL_TABLET | ORAL | Status: DC | PRN

## 2018-06-04 MED ORDER — ACETAMINOPHEN 325 MG PO TABS: 650.0000 mg | ORAL_TABLET | ORAL | Status: DC | PRN

## 2018-06-04 MED ORDER — ZOLPIDEM TARTRATE 5 MG PO TABS: 5.0000 mg | ORAL_TABLET | Freq: Every evening | ORAL | Status: DC | PRN

## 2018-06-04 MED ORDER — BENZOCAINE-MENTHOL 20-0.5 % EX AERO
1.0000 "application " | INHALATION_SPRAY | CUTANEOUS | Status: DC | PRN
  Administered 2018-06-04: 1 via TOPICAL
  Filled 2018-06-04: qty 56

## 2018-06-04 NOTE — Progress Notes (Signed)
Large crown when pushing, good progress being made.

## 2018-06-04 NOTE — Anesthesia Procedure Notes (Signed)
Epidural Patient location during procedure: OB Start time: 06/04/2018 1:45 AM End time: 06/04/2018 1:55 AM  Staffing Anesthesiologist: Achille RichHodierne, Hammad Finkler, MD Performed: anesthesiologist   Preanesthetic Checklist Completed: patient identified, site marked, pre-op evaluation, timeout performed, IV checked, risks and benefits discussed and monitors and equipment checked  Epidural Patient position: sitting Prep: DuraPrep Patient monitoring: heart rate, cardiac monitor, continuous pulse ox and blood pressure Approach: midline Location: L2-L3 Injection technique: LOR saline  Needle:  Needle type: Tuohy  Needle gauge: 17 G Needle length: 9 cm Needle insertion depth: 4 cm Catheter type: closed end flexible Catheter size: 19 Gauge Catheter at skin depth: 10 cm Test dose: negative and Other  Assessment Events: blood not aspirated, injection not painful, no injection resistance and negative IV test  Additional Notes Informed consent obtained prior to proceeding including risk of failure, 1% risk of PDPH, risk of minor discomfort and bruising.  Discussed rare but serious complications including epidural abscess, permanent nerve injury, epidural hematoma.  Discussed alternatives to epidural analgesia and patient desires to proceed.  Timeout performed pre-procedure verifying patient name, procedure, and platelet count.  Patient tolerated procedure well. Reason for block:procedure for pain

## 2018-06-04 NOTE — Progress Notes (Signed)
Pt 10/-1 but unable to feel ctx or pressure.  Pt put on left side with peanut ball and encouraged to not push e[idural button  until able to feel ctx.

## 2018-06-04 NOTE — Progress Notes (Signed)
[facility] ready for delivery, large crown when not pushing.  Good control and progress made.

## 2018-06-04 NOTE — Progress Notes (Signed)
Per RN report, SROM at 0300 with cvx 2cm.    CVX now 6/90/0  Continue pitocin  Mitchel HonourMegan Nazaria Riesen, DO

## 2018-06-04 NOTE — Progress Notes (Signed)
[facility], small crown when pushing.

## 2018-06-04 NOTE — Progress Notes (Signed)
SVD baby girl skin to skin with mother 

## 2018-06-04 NOTE — Progress Notes (Signed)
MOB was referred for history of depression/anxiety. * Referral screened out by Clinical Social Worker because none of the following criteria appear to apply: ~ History of anxiety/depression during this pregnancy, or of post-partum depression following prior delivery. ~ Diagnosis of anxiety and/or depression within last 3 years OR * MOB's symptoms currently being treated with medication and/or therapy. Per chart review, MOB is currently prescribed/taking Celexa.  Please contact the Clinical Social Worker if needs arise, by [facility], or if MOB scores greater than 9/yes to question 10 on [place] Postpartum Depression Screen.  Celso SickleKimberly Asani Mcburney, LCSWA Clinical Social Worker [facility] Cell#: [phone]

## 2018-06-04 NOTE — Progress Notes (Signed)
First push attempted, progress made, pt wanting to push.  MD notified

## 2018-06-04 NOTE — Progress Notes (Signed)
Brooke Bailey is a 27 y.o. G2P0010 at 655w1d by ultrasound admitted for induction of labor due to Elective at term.  Subjective: Comfortable with epidural.    Objective: BP 124/83   Pulse (!) 103   Temp 98 F (36.7 C) (Oral)   Resp 14   Ht 5\' 8"  (1.727 m)   Wt 96.5 kg   BMI 32.36 kg/m  I/O last 3 completed shifts: In: 863.2 [I.V.:863.2] Out: -  No intake/output data recorded.  FHT:  FHR: 140 bpm, variability: moderate,  accelerations:  Present,  decelerations:  Absent UC:   regular, every 3 minutes SVE:   Dilation: 1 Effacement (%): 50 Station: -2 Exam by:: Neal DyB. Bumgarner, RN  Labs: Lab Results  Component Value Date   WBC 9.1 06/03/2018   HGB 12.7 06/03/2018   HCT 36.1 06/03/2018   MCV 96.0 06/03/2018   PLT 167 06/03/2018    Assessment / Plan: Induction of labor due to elective,  progressing well on pitocin  Labor: Progressing normally Preeclampsia:  n/a Fetal Wellbeing:  Category I Pain Control:  Epidural I/D:  n/a Anticipated MOD:  NSVD  Mitchel HonourMegan Charmayne Odell 06/04/2018, 7:49 AM

## 2018-06-04 NOTE — Anesthesia Preprocedure Evaluation (Signed)
Anesthesia Evaluation  Patient identified by MRN, date of birth, ID band Patient awake    Reviewed: Allergy & Precautions, H&P , NPO status , Patient's Chart, lab work & pertinent test results  Airway Mallampati: II   Neck ROM: full    Dental   Pulmonary neg pulmonary ROS,    breath sounds clear to auscultation       Cardiovascular negative cardio ROS   Rhythm:regular Rate:Normal     Neuro/Psych PSYCHIATRIC DISORDERS Anxiety Depression    GI/Hepatic   Endo/Other    Renal/GU      Musculoskeletal   Abdominal   Peds  Hematology   Anesthesia Other Findings   Reproductive/Obstetrics (+) Pregnancy                             Anesthesia Physical Anesthesia Plan  ASA: II  Anesthesia Plan: Epidural   Post-op Pain Management:    Induction: Intravenous  PONV Risk Score and Plan: 2 and Treatment may vary due to age or medical condition  Airway Management Planned: Natural Airway  Additional Equipment:   Intra-op Plan:   Post-operative Plan:   Informed Consent: I have reviewed the patients History and Physical, chart, labs and discussed the procedure including the risks, benefits and alternatives for the proposed anesthesia with the patient or authorized representative who has indicated his/her understanding and acceptance.       Plan Discussed with: Anesthesiologist  Anesthesia Plan Comments:         Anesthesia Quick Evaluation  

## 2018-06-05 ENCOUNTER — Encounter (HOSPITAL_COMMUNITY): Payer: Self-pay

## 2018-06-05 LAB — CBC
HCT: 33.2 % — ABNORMAL LOW (ref 36.0–46.0)
Hemoglobin: 11.5 g/dL — ABNORMAL LOW (ref 12.0–15.0)
MCH: 33.6 pg (ref 26.0–34.0)
MCHC: 34.6 g/dL (ref 30.0–36.0)
MCV: 97.1 fL (ref 80.0–100.0)
Platelets: 131 10*3/uL — ABNORMAL LOW (ref 150–400)
RBC: 3.42 MIL/uL — ABNORMAL LOW (ref 3.87–5.11)
RDW: 14.4 % (ref 11.5–15.5)
WBC: 11.6 10*3/uL — ABNORMAL HIGH (ref 4.0–10.5)
nRBC: 0 % (ref 0.0–0.2)

## 2018-06-05 MED ORDER — IBUPROFEN 600 MG PO TABS: 600.0000 mg | ORAL_TABLET | Freq: Four times a day (QID) | ORAL | 0 refills | Status: DC

## 2018-06-05 NOTE — Anesthesia Postprocedure Evaluation (Signed)
Anesthesia Post Note  Patient: Brooke BirkCassandra C Bailey  Procedure(s) Performed: AN AD HOC LABOR EPIDURAL     Patient location during evaluation: Mother Baby Anesthesia Type: Epidural Level of consciousness: awake and alert Pain management: pain level controlled Vital Signs Assessment: post-procedure vital signs reviewed and stable Respiratory status: spontaneous breathing, nonlabored ventilation and respiratory function stable Cardiovascular status: stable Postop Assessment: no headache, no backache, epidural receding, no apparent nausea or vomiting, patient able to bend at knees, able to ambulate and adequate PO intake Anesthetic complications: no    Last Vitals:  Vitals:   06/05/18 0115 06/05/18 0602  BP: 122/77 123/76  Pulse: 90 78  Resp: 18 18  Temp: 36.8 C 36.5 C  SpO2:      Last Pain:  Vitals:   06/05/18 0602  TempSrc: Oral  PainSc: 2    Pain Goal: Patients Stated Pain Goal: 6 (06/03/18 0804)               Laban EmperorMalinova,Kameo Bains Hristova

## 2018-06-05 NOTE — Discharge Summary (Signed)
Obstetric Discharge Summary Reason for Admission: induction of labor Prenatal Procedures: none Intrapartum Procedures: spontaneous vaginal delivery Postpartum Procedures: none Complications-Operative and Postpartum: 2nd degree perineal laceration Hemoglobin  Date Value Ref Range Status  06/05/2018 11.5 (L) 12.0 - 15.0 g/dL Final   HCT  Date Value Ref Range Status  06/05/2018 33.2 (L) 36.0 - 46.0 % Final    Physical Exam:  General: alert, cooperative and appears stated age 79Lochia: appropriate Uterine Fundus: firm Incision: healing well, no significant drainage, no dehiscence DVT Evaluation: No evidence of DVT seen on physical exam. Negative Homan's sign. No cords or calf tenderness.  Discharge Diagnoses: Term Pregnancy-delivered  Discharge Information: Date: 06/05/2018 Activity: pelvic rest Diet: routine Medications: PNV and Ibuprofen Condition: stable Instructions: refer to practice specific booklet Discharge to: home   Newborn Data: Live born female  Birth Weight: 9 lb 1.2 oz (4116 g) APGAR: 8, 8  Newborn Delivery   Birth date/time:  06/04/2018 14:06:00 Delivery type:  Vaginal, Spontaneous     Home with mother.  Brooke HonourMegan Christalyn Bailey 06/05/2018, 10:42 AM

## 2018-06-05 NOTE — Lactation Note (Signed)
This note was copied from a baby's chart. Lactation Consultation Note  Patient Name: Brooke Bailey Today's Date: 06/05/2018 Reason for consult: Initial assessment;1st time breastfeeding;Term P1, 10 hour female infant. Per mom, she did not attend any breastfeeding classes in her pregnancy. Per mom, she knows how to hand express she informed LC  that nurse taught her earlier on day shift.  Per mom, she has breastfeeding support from family member who works in [employer]rap[employer], [employer] [place] and she helped her latched baby to breast earlier today. Per mom, she had finished breastfeeding infant for [facility]. LC did not observe latch at this time. Mom feels breastfeeding is going well.  Mom will breastfeed according hunger cues, 8 to 12 times within 24 hours including nights and not exceed 3 hours without breastfeeding infant. LC discussed I & O. Mom made aware of O/P services, breastfeeding support groups, community resources, and our phone # for post-discharge questions.  Maternal Data Formula Feeding for Exclusion: No Has patient been taught Hand Expression?: Yes(Per mom, by nurse.) Does the patient have breastfeeding experience prior to this delivery?: No  Feeding Feeding Type: Breast Fed  LATCH Score Latch: Repeated attempts needed to sustain latch, nipple held in mouth throughout feeding, stimulation needed to elicit sucking reflex.  Audible Swallowing: A few with stimulation  Type of Nipple: Everted at rest and after stimulation  Comfort (Breast/Nipple): Soft / non-tender  Hold (Positioning): No assistance needed to correctly position infant at breast.  LATCH Score: 8  Interventions Interventions: Breast feeding basics reviewed  Lactation Tools Discussed/Used WIC Program: No   Consult Status Consult Status: Follow-up Date: 06/05/18 Follow-up type: In-patient    Danelle EarthlyRobin Jamyria Bailey 06/05/2018, 12:39 AM

## 2018-06-05 NOTE — Progress Notes (Signed)
Post Partum Day 1 Subjective: no complaints, up ad lib, voiding, tolerating PO and + flatus.  Patient requests early discharge.  Objective: Blood pressure 123/76, pulse 78, temperature 97.7 F (36.5 C), temperature source Oral, resp. rate 18, height 5\' 8"  (1.727 m), weight 96.5 kg, SpO2 97 %, unknown if currently breastfeeding.  Physical Exam:  General: alert, cooperative and appears stated age Lochia: appropriate Uterine Fundus: firm Incision: healing well, no significant drainage, no dehiscence DVT Evaluation: No evidence of DVT seen on physical exam. Negative Homan's sign. No cords or calf tenderness.  Recent Labs    06/03/18 0116 06/05/18 0545  HGB 12.7 11.5*  HCT 36.1 33.2*    Assessment/Plan: Discharge home and Breastfeeding   LOS: 2 days   Brooke Bailey 06/05/2018, 10:39 AM

## 2018-06-05 NOTE — Discharge Instructions (Signed)
Call MD for T>100.4, heavy vaginal bleeding, severe abdominal pain, or respiratory distress.  Call office to schedule postpartum visit in 6 weeks.  Pelvic rest x 6 weeks.   

## 2018-06-15 ENCOUNTER — Inpatient Hospital Stay (HOSPITAL_COMMUNITY): Admission: AD | Admit: 2018-06-15 | Payer: 59 | Source: Home / Self Care | Admitting: Obstetrics and Gynecology

## 2019-06-06 ENCOUNTER — Other Ambulatory Visit: Payer: Self-pay

## 2019-06-06 ENCOUNTER — Encounter (HOSPITAL_BASED_OUTPATIENT_CLINIC_OR_DEPARTMENT_OTHER): Payer: Self-pay | Admitting: Obstetrics and Gynecology

## 2019-06-06 NOTE — Progress Notes (Signed)
Spoke with patient via telephone for pre op interview. NPO after MN. Patient to take Celexa and Xanax PRN with a sip of water AM of surgery. Will need T&S AM of surgery. Arrival time 0530.

## 2019-06-07 NOTE — H&P (Signed)
NAME: Brooke Bailey, Brooke Bailey MEDICAL RECORD JO:[id number] ACCOUNT [id number] DATE OF BIRTH:[birth year] FACILITY: [facility] LOCATION: [facility] PHYSICIAN:Amiayah Giebel Garry Heater, MD  HISTORY AND PHYSICAL  DATE OF ADMISSION:  06/14/2019  CHIEF COMPLAINT:  Missed AB.  HISTORY OF PRESENT ILLNESS:  A 28 year old G2 P1 who has been followed with this pregnancy early with two ultrasounds that have not showed any change over 10 days, in particular fetal pole was noted without any appearance of fetal heart rate.  She  delivered 1 prior pregnancy at term 12/19.  She presents now for D and E.  This procedure including risks regarding bleeding, infection, other complications that may require additional surgery all reviewed with her, which she understands and accepts.  ALLERGIES:  None.  CURRENT MEDICATIONS:  Citalopram, Ventolin p.r.n., prenatal vitamins.  PAST SURGICAL HISTORY:  Breast augmentation, 1 prior vaginal delivery.  SOCIAL HISTORY:  She is married, no history of alcohol, tobacco or drug use.  FAMILY HISTORY:  Negative.  PHYSICAL EXAMINATION: VITAL SIGNS:  Temperature 98.2, blood pressure 118/70. HEENT:  Unremarkable. NECK:  Supple, without masses. LUNGS:  Clear. CARDIOVASCULAR:  Regular rate and rhythm without murmurs, rubs or gallops. BREASTS:  Without masses, tenderness, or nipple discharge. ABDOMEN:  Soft, flat, nontender. PELVIC:  Vulva, vagina, cervix normal.  Uterus 6 weeks' size.  Adnexa negative. EXTREMITIES:  Unremarkable. NEUROLOGIC:  Unremarkable.  IMPRESSION:  Early intrauterine pregnancy, missed abortion for dilatation and evacuation procedure and risks reviewed as above.  TN/NUANCE  D:06/06/2019 T:06/06/2019 JOB:[id number]

## 2019-06-11 ENCOUNTER — Other Ambulatory Visit (HOSPITAL_COMMUNITY): Payer: 59

## 2019-06-14 ENCOUNTER — Ambulatory Visit (HOSPITAL_BASED_OUTPATIENT_CLINIC_OR_DEPARTMENT_OTHER): Admission: RE | Admit: 2019-06-14 | Payer: 59 | Source: Home / Self Care | Admitting: Obstetrics and Gynecology

## 2019-06-14 SURGERY — DILATION AND EVACUATION, UTERUS
Anesthesia: Choice

## 2019-06-17 NOTE — L&D Delivery Note (Signed)
Delivery Note At bedside for rapid change in unblocked multip.  Cervix with small anterior lip, patient unable to breathe through contractions.  At 8:33 AM a viable female was delivered via Vaginal, Spontaneous (Presentation: Right Occiput Anterior).  APGAR: 9, 9; weight 6 lb 5.6 oz (2880 g).   Placenta status: Spontaneous, Intact.  Cord: 3 vessels with the following complications: None.  Anesthesia: None, local for repair Episiotomy: None Lacerations: 1st degree Suture Repair: 2.0 vicryl Est. Blood Loss (mL):  200 cc  Mom to postpartum.  Baby to Couplet care / Skin to Skin.  Lyn Henri 05/27/2020, 9:14 AM

## 2020-05-22 ENCOUNTER — Telehealth (HOSPITAL_COMMUNITY): Payer: Self-pay | Admitting: *Deleted

## 2020-05-22 NOTE — Telephone Encounter (Signed)
Preadmission screen  

## 2020-05-24 ENCOUNTER — Telehealth (HOSPITAL_COMMUNITY): Payer: Self-pay | Admitting: *Deleted

## 2020-05-24 NOTE — Telephone Encounter (Signed)
Preadmission screen  

## 2020-05-25 ENCOUNTER — Other Ambulatory Visit (HOSPITAL_COMMUNITY): Payer: Self-pay | Admitting: *Deleted

## 2020-05-25 ENCOUNTER — Telehealth (HOSPITAL_COMMUNITY): Payer: Self-pay | Admitting: *Deleted

## 2020-05-25 NOTE — Telephone Encounter (Signed)
Preadmission screen  

## 2020-05-27 ENCOUNTER — Other Ambulatory Visit: Payer: Self-pay

## 2020-05-27 ENCOUNTER — Encounter (HOSPITAL_COMMUNITY): Payer: Self-pay | Admitting: Obstetrics and Gynecology

## 2020-05-27 ENCOUNTER — Inpatient Hospital Stay (HOSPITAL_COMMUNITY)
Admission: AD | Admit: 2020-05-27 | Discharge: 2020-05-28 | DRG: 807 | Disposition: A | Discharge status: Home / Self Care | Payer: 59 | Attending: Obstetrics and Gynecology | Admitting: Obstetrics and Gynecology

## 2020-05-27 DIAGNOSIS — O26893 Other specified pregnancy related conditions, third trimester: Secondary | ICD-10-CM | POA: Diagnosis present

## 2020-05-27 DIAGNOSIS — Z3A37 37 weeks gestation of pregnancy: Secondary | ICD-10-CM

## 2020-05-27 DIAGNOSIS — Z20822 Contact with and (suspected) exposure to covid-19: Secondary | ICD-10-CM | POA: Diagnosis present

## 2020-05-27 LAB — POCT FERN TEST: POCT Fern Test: NEGATIVE

## 2020-05-27 LAB — CBC
HCT: 39.6 % (ref 36.0–46.0)
Hemoglobin: 13.6 g/dL (ref 12.0–15.0)
MCH: 32.1 pg (ref 26.0–34.0)
MCHC: 34.3 g/dL (ref 30.0–36.0)
MCV: 93.4 fL (ref 80.0–100.0)
Platelets: 177 10*3/uL (ref 150–400)
RBC: 4.24 MIL/uL (ref 3.87–5.11)
RDW: 13.1 % (ref 11.5–15.5)
WBC: 10 10*3/uL (ref 4.0–10.5)
nRBC: 0 % (ref 0.0–0.2)

## 2020-05-27 LAB — TYPE AND SCREEN
ABO/RH(D): O POS
Antibody Screen: NEGATIVE

## 2020-05-27 LAB — RESP PANEL BY RT-PCR (FLU A&B, COVID) ARPGX2
Influenza A by PCR: NEGATIVE
Influenza B by PCR: NEGATIVE
SARS Coronavirus 2 by RT PCR: NEGATIVE

## 2020-05-27 LAB — RPR: RPR Ser Ql: NONREACTIVE

## 2020-05-27 MED ORDER — LACTATED RINGERS IV SOLN: 500.0000 mL | INTRAVENOUS | Status: DC | PRN

## 2020-05-27 MED ORDER — OXYTOCIN-SODIUM CHLORIDE 30-0.9 UT/500ML-% IV SOLN
INTRAVENOUS | Status: AC
  Administered 2020-05-27: 333 mL via INTRAVENOUS
  Filled 2020-05-27: qty 500

## 2020-05-27 MED ORDER — HYDROMORPHONE HCL 1 MG/ML IJ SOLN
0.5000 mg | Freq: Once | INTRAMUSCULAR | Status: AC
  Administered 2020-05-27: 0.5 mg via INTRAVENOUS

## 2020-05-27 MED ORDER — CITALOPRAM HYDROBROMIDE 20 MG PO TABS
20.0000 mg | ORAL_TABLET | Freq: Every day | ORAL | Status: DC
  Administered 2020-05-27: 20 mg via ORAL
  Filled 2020-05-27: qty 1

## 2020-05-27 MED ORDER — ACETAMINOPHEN 325 MG PO TABS: 650.0000 mg | ORAL_TABLET | ORAL | Status: DC | PRN

## 2020-05-27 MED ORDER — COCONUT OIL OIL: 1.0000 "application " | TOPICAL_OIL | Status: DC | PRN

## 2020-05-27 MED ORDER — ZOLPIDEM TARTRATE 5 MG PO TABS: 5.0000 mg | ORAL_TABLET | Freq: Every evening | ORAL | Status: DC | PRN

## 2020-05-27 MED ORDER — WITCH HAZEL-GLYCERIN EX PADS: 1.0000 "application " | MEDICATED_PAD | CUTANEOUS | Status: DC | PRN

## 2020-05-27 MED ORDER — DIBUCAINE (PERIANAL) 1 % EX OINT: 1.0000 "application " | TOPICAL_OINTMENT | CUTANEOUS | Status: DC | PRN

## 2020-05-27 MED ORDER — OXYCODONE HCL 5 MG PO TABS: 5.0000 mg | ORAL_TABLET | ORAL | Status: DC | PRN

## 2020-05-27 MED ORDER — DIPHENHYDRAMINE HCL 25 MG PO CAPS: 25.0000 mg | ORAL_CAPSULE | Freq: Four times a day (QID) | ORAL | Status: DC | PRN

## 2020-05-27 MED ORDER — OXYCODONE-ACETAMINOPHEN 5-325 MG PO TABS: 1.0000 | ORAL_TABLET | ORAL | Status: DC | PRN

## 2020-05-27 MED ORDER — BUTORPHANOL TARTRATE 1 MG/ML IJ SOLN: 1.0000 mg | INTRAMUSCULAR | Status: DC | PRN

## 2020-05-27 MED ORDER — IBUPROFEN 600 MG PO TABS
600.0000 mg | ORAL_TABLET | Freq: Four times a day (QID) | ORAL | Status: DC
  Administered 2020-05-27 – 2020-05-28 (×4): 600 mg via ORAL
  Filled 2020-05-27 (×4): qty 1

## 2020-05-27 MED ORDER — ONDANSETRON HCL 4 MG PO TABS: 4.0000 mg | ORAL_TABLET | ORAL | Status: DC | PRN

## 2020-05-27 MED ORDER — LIDOCAINE HCL (PF) 1 % IJ SOLN
30.0000 mL | INTRAMUSCULAR | Status: AC | PRN
  Administered 2020-05-27: 30 mL via SUBCUTANEOUS

## 2020-05-27 MED ORDER — OXYTOCIN-SODIUM CHLORIDE 30-0.9 UT/500ML-% IV SOLN: 2.5000 [IU]/h | INTRAVENOUS | Status: DC

## 2020-05-27 MED ORDER — TETANUS-DIPHTH-ACELL PERTUSSIS 5-2.5-18.5 LF-MCG/0.5 IM SUSY: 0.5000 mL | PREFILLED_SYRINGE | Freq: Once | INTRAMUSCULAR | Status: DC

## 2020-05-27 MED ORDER — SENNOSIDES-DOCUSATE SODIUM 8.6-50 MG PO TABS: 2.0000 | ORAL_TABLET | ORAL | Status: DC

## 2020-05-27 MED ORDER — OXYTOCIN BOLUS FROM INFUSION: 333.0000 mL | Freq: Once | INTRAVENOUS | Status: AC

## 2020-05-27 MED ORDER — HYDROMORPHONE HCL 1 MG/ML IJ SOLN
INTRAMUSCULAR | Status: AC
  Administered 2020-05-27: 0.5 mg via INTRAVENOUS
  Filled 2020-05-27: qty 1

## 2020-05-27 MED ORDER — CITALOPRAM HYDROBROMIDE 20 MG PO TABS
20.0000 mg | ORAL_TABLET | Freq: Every day | ORAL | Status: DC
  Filled 2020-05-27: qty 1

## 2020-05-27 MED ORDER — LACTATED RINGERS IV SOLN: INTRAVENOUS | Status: DC

## 2020-05-27 MED ORDER — ONDANSETRON HCL 4 MG/2ML IJ SOLN: 4.0000 mg | INTRAMUSCULAR | Status: DC | PRN

## 2020-05-27 MED ORDER — OXYCODONE-ACETAMINOPHEN 5-325 MG PO TABS: 2.0000 | ORAL_TABLET | ORAL | Status: DC | PRN

## 2020-05-27 MED ORDER — SIMETHICONE 80 MG PO CHEW: 80.0000 mg | CHEWABLE_TABLET | ORAL | Status: DC | PRN

## 2020-05-27 MED ORDER — BENZOCAINE-MENTHOL 20-0.5 % EX AERO
1.0000 "application " | INHALATION_SPRAY | CUTANEOUS | Status: DC | PRN
  Administered 2020-05-27: 1 via TOPICAL
  Filled 2020-05-27: qty 56

## 2020-05-27 MED ORDER — PRENATAL MULTIVITAMIN CH
1.0000 | ORAL_TABLET | Freq: Every day | ORAL | Status: DC
  Administered 2020-05-27: 1 via ORAL
  Filled 2020-05-27: qty 1

## 2020-05-27 MED ORDER — HYDROXYZINE HCL 50 MG PO TABS: 50.0000 mg | ORAL_TABLET | Freq: Four times a day (QID) | ORAL | Status: DC | PRN

## 2020-05-27 MED ORDER — ONDANSETRON HCL 4 MG/2ML IJ SOLN: 4.0000 mg | Freq: Four times a day (QID) | INTRAMUSCULAR | Status: DC | PRN

## 2020-05-27 MED ORDER — LIDOCAINE HCL (PF) 1 % IJ SOLN
INTRAMUSCULAR | Status: AC
  Administered 2020-05-27: 30 mL via SUBCUTANEOUS
  Filled 2020-05-27: qty 30

## 2020-05-27 MED ORDER — SOD CITRATE-CITRIC ACID 500-334 MG/5ML PO SOLN: 30.0000 mL | ORAL | Status: DC | PRN

## 2020-05-27 NOTE — MAU Note (Signed)
Presents with c/o ctxs since 0600 this morning.  Reports ctxs are 5 minutes apart.  Endorses +FM.  Reports noted pink discharge with wiping mixed with fluid.

## 2020-05-27 NOTE — H&P (Signed)
OB History and Physical   Brooke Bailey is a 29 y.o. female [phone] presenting for intense and frequent contractions at [redacted]w[redacted]d.  Cervix was 4 cm dilation on initial check and admission orders were placed.  She progressed to 6 cm shortly thereafter, followed by 9.5 upon arrival to L&D room.  GBS negative. Pt previously 2 cm in office.    OB History    Gravida  3   Para  1   Term  1   Preterm      AB  1   Living  1     SAB  1   IAB      Ectopic      Multiple  0   Live Births  1          Past Medical History:  Diagnosis Date  . Anxiety   . Depression   . Sepsis(995.91)    x 2 as a child   Past Surgical History:  Procedure Laterality Date  . BREAST ENHANCEMENT SURGERY     Family History: family history includes Cancer in her maternal grandmother and paternal grandmother; Heart disease in her maternal grandmother; Hypertension in her mother; Stroke in her maternal grandmother. Social History:  reports that she has never smoked. She has never used smokeless tobacco. She reports current alcohol use. She reports that she does not use drugs.     Maternal Diabetes: No Genetic Screening: Normal Maternal Ultrasounds/Referrals: Normal Fetal Ultrasounds or other Referrals:  None Maternal Substance Abuse:  No Significant Maternal Medications:  Meds include: Other: citalopram Significant Maternal Lab Results:  Group B Strep negative Other Comments:  None  Review of Systems History Dilation: 10 Effacement (%): 100 Station: -1 Exam by:: Dr. Lorane Gell MD Blood pressure 133/74, pulse 64, temperature (!) 97.5 F (36.4 C), temperature source Oral, resp. rate 17, SpO2 98 %, unknown if currently breastfeeding. Exam Physical Exam  Prenatal labs: ABO, Rh: --/--/PENDING (12/12 0818) Antibody: PENDING (12/12 0818) Rubella:   RPR:    HBsAg:    HIV:    GBS:     Assessment/Plan: . Admitted to labor and delivery.   . GBS neg, PCN not indicated . Precipitous  delivery followed, uncomplicated.  See delivery note for details.  Brooke Bailey 05/27/2020, 9:16 AM

## 2020-05-27 NOTE — Plan of Care (Signed)
Completed Glorene Leitzke RN 

## 2020-05-28 LAB — CBC
HCT: 30.8 % — ABNORMAL LOW (ref 36.0–46.0)
Hemoglobin: 10.8 g/dL — ABNORMAL LOW (ref 12.0–15.0)
MCH: 33.2 pg (ref 26.0–34.0)
MCHC: 35.1 g/dL (ref 30.0–36.0)
MCV: 94.8 fL (ref 80.0–100.0)
Platelets: 127 10*3/uL — ABNORMAL LOW (ref 150–400)
RBC: 3.25 MIL/uL — ABNORMAL LOW (ref 3.87–5.11)
RDW: 13.4 % (ref 11.5–15.5)
WBC: 9.4 10*3/uL (ref 4.0–10.5)
nRBC: 0 % (ref 0.0–0.2)

## 2020-05-28 NOTE — Discharge Summary (Signed)
Postpartum Discharge Summary     Patient Name: Brooke Bailey DOB: 03-13-1991 MRN: [id number]  Date of admission: 05/27/2020 Delivery date:05/27/2020  Delivering provider: Eyvonne Mechanic A  Date of discharge: 05/28/2020  Admitting diagnosis: Pregnancy [Z34.90] Intrauterine pregnancy: [redacted]w[redacted]d    Secondary diagnosis:  Active Problems:   * No active hospital problems. *  Additional problems: none    Discharge diagnosis: Term Pregnancy Delivered                                              Post partum procedures:none Augmentation: N/A Complications: None  Hospital course: Onset of Labor With Vaginal Delivery      29y.o. yo [id number] 352w6das admitted in Active Labor on 05/27/2020. Patient had an uncomplicated labor course as follows:  Membrane Rupture Time/Date: 8:18 AM ,05/27/2020   Delivery Method:Vaginal, Spontaneous  Episiotomy: None  Lacerations:  1st degree  Patient had an uncomplicated postpartum course.  She is ambulating, tolerating a regular diet, passing flatus, and urinating well. Patient is discharged home in stable condition on 05/28/20.  Newborn Data: Birth date:05/27/2020  Birth time:8:33 AM  Gender:Female  Living status:Living  Apgars:9 ,9  Weight:2880 g   Magnesium Sulfate received: No BMZ received: No Rhophylac:N/A MMR:N/A T-DaP:Given prenatally Flu: N/A Transfusion:No  Physical exam  Vitals:   05/27/20 1618 05/27/20 2003 05/27/20 2319 05/28/20 0512  BP: 124/79 128/84 126/85 127/85  Pulse: 72 71 64 70  Resp: _0 Temp: 98.3 F (36.8 C) 98.1 F (36.7 C) 98.2 F (36.8 C) 97.7 F (36.5 C)  TempSrc: Axillary Oral  Oral  SpO2: 100% 100% 98% 98%  Weight:      Height:       General: alert Lochia: appropriate Uterine Fundus: firm Incision: N/A DVT Evaluation: No evidence of DVT seen on physical exam. Labs: Lab Results  Component Value Date   WBC 9.4 05/28/2020   HGB 10.8 (L) 05/28/2020   HCT 30.8 (L) 05/28/2020   MCV  94.8 05/28/2020   PLT 127 (L) 05/28/2020   No flowsheet data found. Edinburgh Score: Edinburgh Postnatal Depression Scale Screening Tool 05/27/2020  I have been able to laugh and see the funny side of things. 0  I have looked forward with enjoyment to things. 0  I have blamed myself unnecessarily when things went wrong. 0  I have been anxious or worried for no good reason. 0  I have felt scared or panicky for no good reason. 0  Things have been getting on top of me. 0  I have been so unhappy that I have had difficulty sleeping. 0  I have felt sad or miserable. 0  I have been so unhappy that I have been crying. 0  The thought of harming myself has occurred to me. 0  Edinburgh Postnatal Depression Scale Total 0      After visit meds:  Allergies as of 05/28/2020   No Known Allergies     Medication List    STOP taking these medications   ALPRAZolam 0.5 MG tablet Commonly known as: XANAX     TAKE these medications   citalopram 20 MG tablet Commonly known as: CELEXA Take 20 mg by mouth daily.        Discharge home in stable condition Infant Feeding: Bottle and Breast Infant Disposition:home with mother Discharge  instruction: per After Visit Summary and Postpartum booklet. Activity: Advance as tolerated. Pelvic rest for 6 weeks.  Diet: routine diet Anticipated Birth Control: Unsure Postpartum Appointment:6 weeks Additional Postpartum F/U: n/a Future Appointments: Future Appointments  Date Time Provider Aubrey  06/02/2020  9:40 AM [facility] None   Follow up Visit:      05/28/2020 Tyson Dense, MD

## 2020-05-28 NOTE — Progress Notes (Signed)
MOB was referred for history of depression/anxiety. * Referral screened out by Clinical Social Worker because none of the following criteria appear to apply: ~ History of anxiety/depression during this pregnancy, or of post-partum depression following prior delivery. ~ Diagnosis of anxiety and/or depression within last 3 years OR * MOB's symptoms currently being treated with medication and/or therapy. Per further chart review, MOB on Celexa for anxiety and depression.     CSW aware that MOB scored 0 on Edinburgh with no concerns noted.    Brooke Bailey, MSW, LCSW [facility] [phone]

## 2020-06-02 ENCOUNTER — Other Ambulatory Visit (HOSPITAL_COMMUNITY): Payer: 59

## 2020-06-04 ENCOUNTER — Inpatient Hospital Stay (HOSPITAL_COMMUNITY): Admission: AD | Admit: 2020-06-04 | Payer: 59 | Source: Home / Self Care | Admitting: Obstetrics and Gynecology

## 2020-06-04 ENCOUNTER — Inpatient Hospital Stay (HOSPITAL_COMMUNITY): Payer: 59

## 2021-03-21 ENCOUNTER — Other Ambulatory Visit: Payer: Self-pay | Admitting: Obstetrics & Gynecology

## 2021-03-21 DIAGNOSIS — Z363 Encounter for antenatal screening for malformations: Secondary | ICD-10-CM

## 2021-03-21 DIAGNOSIS — O0990 Supervision of high risk pregnancy, unspecified, unspecified trimester: Secondary | ICD-10-CM

## 2021-04-04 ENCOUNTER — Encounter: Payer: Self-pay | Admitting: *Deleted

## 2021-04-08 ENCOUNTER — Ambulatory Visit (HOSPITAL_BASED_OUTPATIENT_CLINIC_OR_DEPARTMENT_OTHER): Payer: BC Managed Care – PPO | Admitting: Obstetrics

## 2021-04-08 ENCOUNTER — Encounter: Payer: Self-pay | Admitting: *Deleted

## 2021-04-08 ENCOUNTER — Other Ambulatory Visit: Payer: Self-pay

## 2021-04-08 ENCOUNTER — Ambulatory Visit: Payer: BC Managed Care – PPO | Admitting: *Deleted

## 2021-04-08 ENCOUNTER — Other Ambulatory Visit: Payer: Self-pay | Admitting: *Deleted

## 2021-04-08 ENCOUNTER — Ambulatory Visit: Payer: BC Managed Care – PPO | Attending: Obstetrics & Gynecology

## 2021-04-08 VITALS — BP 121/69 | HR 79

## 2021-04-08 DIAGNOSIS — Z3A12 12 weeks gestation of pregnancy: Secondary | ICD-10-CM

## 2021-04-08 DIAGNOSIS — Z3689 Encounter for other specified antenatal screening: Secondary | ICD-10-CM | POA: Diagnosis present

## 2021-04-08 DIAGNOSIS — O0990 Supervision of high risk pregnancy, unspecified, unspecified trimester: Secondary | ICD-10-CM | POA: Diagnosis not present

## 2021-04-08 DIAGNOSIS — O359XX0 Maternal care for (suspected) fetal abnormality and damage, unspecified, not applicable or unspecified: Secondary | ICD-10-CM | POA: Diagnosis present

## 2021-04-08 DIAGNOSIS — O30191 Triplet pregnancy, unable to determine number of placenta and number of amniotic sacs, first trimester: Secondary | ICD-10-CM | POA: Insufficient documentation

## 2021-04-08 DIAGNOSIS — O30193 Triplet pregnancy, unable to determine number of placenta and number of amniotic sacs, third trimester: Secondary | ICD-10-CM

## 2021-04-08 DIAGNOSIS — O358XX0 Maternal care for other (suspected) fetal abnormality and damage, not applicable or unspecified: Secondary | ICD-10-CM | POA: Diagnosis not present

## 2021-04-08 DIAGNOSIS — Z363 Encounter for antenatal screening for malformations: Secondary | ICD-10-CM

## 2021-04-08 NOTE — Progress Notes (Signed)
MFM Note  Brooke Bailey was seen for a first trimester ultrasound as a possible triplet gestation with two vanishing fetuses was noted on an early ultrasound performed in your office.  She denies any problems in her current pregnancy and denies any significant past medical history.  The patient reports that she had to take Clomid to conceive her two other living children.  However, this was a spontaneously conceived pregnancy without any medications.  She reports a significant family history of cleft palate without a cleft lip.  Her second daughter was born with a cleft palate without a cleft lip.  Her father also had a cleft palate.  The crown-rump length measured today is consistent with her gestational age, giving her an [facility] of Oct 19, 2021.  The nuchal translucency measured 1.4 mm, which is within normal limits.  There was also a nasal bone present in the fetus.  The patient was reassured that these two findings indicate that her risk of having a fetus with Down syndrome is low.  A viable singleton gestation was noted on today's ultrasound exam.  The possible remnants of the other two gestational sacs that were noted in your office were not visualized today.  The patient showed me an image of the ultrasound that was obtained in your office.  The two other cystic structures were located within the uterine wall, possibly in the lower uterine segment.  These two cystic structures were not a part of the gestational sac of the viable fetus.    The patient was advised that based on the appearance of the image, I highly doubt that these two structures were the remnants of sacs from two other fetuses as they were not connected to the sac of the viable fetus.  If these two cystic structures were located in the lower uterine segment, they could have been nabothian cysts in the cervix.  As the patient would really like to have a cell free DNA test drawn to screen for fetal chromosomal abnormalities, she  was advised that she can have the test drawn in your office later this afternoon as I believe that this is most likely a singleton gestation.    She can be reassured if the cell free DNA test indicates a low risk.  However, if the cell free DNA test shows any abnormalities that is consistent with a multiple gestation, we will speak to her about an amniocentesis or just rely on the ultrasound images for assessment of fetal chromosomal abnormalities.  The patient is comfortable with this plan.   A detailed fetal anatomy scan was scheduled in our office at around 19 weeks.  We will assess the fetal palate during that exam.    The patient stated that all of her questions were answered.    A total of 30 minutes was spent counseling and coordinating the care for this patient.  Greater than 50% of the time was spent in direct face-to-face contact.

## 2021-05-27 ENCOUNTER — Ambulatory Visit: Payer: BC Managed Care – PPO

## 2021-09-29 ENCOUNTER — Inpatient Hospital Stay (HOSPITAL_COMMUNITY)
Admission: AD | Admit: 2021-09-29 | Discharge: 2021-10-01 | DRG: 807 | Disposition: A | Discharge status: Home / Self Care | Payer: BC Managed Care – PPO | Attending: Obstetrics and Gynecology | Admitting: Obstetrics and Gynecology

## 2021-09-29 ENCOUNTER — Encounter (HOSPITAL_COMMUNITY): Payer: Self-pay | Admitting: Obstetrics and Gynecology

## 2021-09-29 ENCOUNTER — Other Ambulatory Visit: Payer: Self-pay

## 2021-09-29 DIAGNOSIS — Z3A37 37 weeks gestation of pregnancy: Secondary | ICD-10-CM

## 2021-09-29 DIAGNOSIS — O26893 Other specified pregnancy related conditions, third trimester: Secondary | ICD-10-CM | POA: Diagnosis present

## 2021-09-29 DIAGNOSIS — O99344 Other mental disorders complicating childbirth: Secondary | ICD-10-CM | POA: Diagnosis present

## 2021-09-29 DIAGNOSIS — F419 Anxiety disorder, unspecified: Secondary | ICD-10-CM | POA: Diagnosis present

## 2021-09-29 DIAGNOSIS — Z3490 Encounter for supervision of normal pregnancy, unspecified, unspecified trimester: Principal | ICD-10-CM

## 2021-09-29 MED ORDER — EPHEDRINE 5 MG/ML INJ: 10.0000 mg | INTRAVENOUS | Status: DC | PRN

## 2021-09-29 MED ORDER — CITALOPRAM HYDROBROMIDE 20 MG PO TABS
20.0000 mg | ORAL_TABLET | Freq: Every day | ORAL | Status: DC
  Filled 2021-09-29: qty 1

## 2021-09-29 MED ORDER — PHENYLEPHRINE 40 MCG/ML (10ML) SYRINGE FOR IV PUSH (FOR BLOOD PRESSURE SUPPORT)
80.0000 ug | PREFILLED_SYRINGE | INTRAVENOUS | Status: DC | PRN
  Filled 2021-09-29: qty 10

## 2021-09-29 MED ORDER — OXYCODONE-ACETAMINOPHEN 5-325 MG PO TABS: 2.0000 | ORAL_TABLET | ORAL | Status: DC | PRN

## 2021-09-29 MED ORDER — OXYTOCIN BOLUS FROM INFUSION
333.0000 mL | Freq: Once | INTRAVENOUS | Status: AC
  Administered 2021-09-30: 333 mL via INTRAVENOUS

## 2021-09-29 MED ORDER — FENTANYL-BUPIVACAINE-NACL 0.5-0.125-0.9 MG/250ML-% EP SOLN
12.0000 mL/h | EPIDURAL | Status: DC | PRN
  Filled 2021-09-29: qty 250

## 2021-09-29 MED ORDER — PHENYLEPHRINE 40 MCG/ML (10ML) SYRINGE FOR IV PUSH (FOR BLOOD PRESSURE SUPPORT): 80.0000 ug | PREFILLED_SYRINGE | INTRAVENOUS | Status: DC | PRN

## 2021-09-29 MED ORDER — LIDOCAINE HCL (PF) 1 % IJ SOLN: 30.0000 mL | INTRAMUSCULAR | Status: DC | PRN

## 2021-09-29 MED ORDER — ONDANSETRON HCL 4 MG/2ML IJ SOLN: 4.0000 mg | Freq: Four times a day (QID) | INTRAMUSCULAR | Status: DC | PRN

## 2021-09-29 MED ORDER — FENTANYL CITRATE (PF) 100 MCG/2ML IJ SOLN
50.0000 ug | INTRAMUSCULAR | Status: DC | PRN
  Administered 2021-09-30: 100 ug via INTRAVENOUS
  Filled 2021-09-29: qty 2

## 2021-09-29 MED ORDER — SOD CITRATE-CITRIC ACID 500-334 MG/5ML PO SOLN: 30.0000 mL | ORAL | Status: DC | PRN

## 2021-09-29 MED ORDER — LACTATED RINGERS IV SOLN: 500.0000 mL | Freq: Once | INTRAVENOUS | Status: DC

## 2021-09-29 MED ORDER — ACETAMINOPHEN 325 MG PO TABS: 650.0000 mg | ORAL_TABLET | ORAL | Status: DC | PRN

## 2021-09-29 MED ORDER — SODIUM CHLORIDE 0.9 % IV SOLN
2.0000 g | Freq: Once | INTRAVENOUS | Status: AC
  Administered 2021-09-30: 2 g via INTRAVENOUS
  Filled 2021-09-29: qty 2000

## 2021-09-29 MED ORDER — LACTATED RINGERS IV SOLN
500.0000 mL | INTRAVENOUS | Status: DC | PRN
  Administered 2021-09-29: 1000 mL via INTRAVENOUS

## 2021-09-29 MED ORDER — SODIUM CHLORIDE 0.9 % IV SOLN: 1.0000 g | INTRAVENOUS | Status: DC

## 2021-09-29 MED ORDER — OXYTOCIN-SODIUM CHLORIDE 30-0.9 UT/500ML-% IV SOLN
2.5000 [IU]/h | INTRAVENOUS | Status: DC
  Filled 2021-09-29: qty 500

## 2021-09-29 MED ORDER — OXYCODONE-ACETAMINOPHEN 5-325 MG PO TABS: 1.0000 | ORAL_TABLET | ORAL | Status: DC | PRN

## 2021-09-29 MED ORDER — FLEET ENEMA 7-19 GM/118ML RE ENEM: 1.0000 | ENEMA | RECTAL | Status: DC | PRN

## 2021-09-29 MED ORDER — LACTATED RINGERS IV SOLN: INTRAVENOUS | Status: DC

## 2021-09-29 MED ORDER — DIPHENHYDRAMINE HCL 50 MG/ML IJ SOLN: 12.5000 mg | INTRAMUSCULAR | Status: DC | PRN

## 2021-09-29 NOTE — MAU Note (Signed)
.  Brooke Bailey is a 31 y.o. at [redacted]w[redacted]d here in MAU reporting: this morning started having light bloody show. Has been wearing peri pad today with scant clear liquid. Unsure of SROM. Contractions irregular every 10-15 min per pt.  ? ?Onset of complaint: 2000 ?Pain score: 7 low abd and low back ?Vitals:  ? 09/29/21 2215  ?BP: 135/78  ?Pulse: 93  ?Resp: 17  ?Temp: 97.8 ?F (36.6 ?C)  ?SpO2: 99%  ?   ?FHT:127bpm ?Lab orders placed from triage:   MAU labor ?

## 2021-09-29 NOTE — H&P (Signed)
Brooke Bailey is a 31 y.o. female presenting for UCs and light bloody show since this morning. Pregnancy complicated by 1) Hx of HSV Ab-cold sores only  2) anxiety on citalopram 20gm qd  3) Hx of 9# 1oz baby with pregnancy #1 4) Hx of precipitous delivery with pregnancy #2 5) cleft palate with pregnancy #2> MFM U/S show normal anatomy. U/S in office @ 35 5/7 wks notes EFW 5# 13 oz  6) GBBS done in office 09/26/21>results pending. ?OB History   ? ? Gravida  ?5  ? Para  ?2  ? Term  ?2  ? Preterm  ?   ? AB  ?2  ? Living  ?2  ?  ? ? SAB  ?2  ? IAB  ?   ? Ectopic  ?   ? Multiple  ?0  ? Live Births  ?2  ?   ?  ?  ? ?Past Medical History:  ?Diagnosis Date  ? Anxiety   ? Depression   ? Sepsis(995.91)   ? x 2 as a child  ? ?Past Surgical History:  ?Procedure Laterality Date  ? BREAST ENHANCEMENT SURGERY    ? ?Family History: family history includes Cancer in her maternal grandmother and paternal grandmother; Heart disease in her maternal grandmother; Hypertension in her mother; Stroke in her maternal grandmother. ?Social History:  reports that she has never smoked. She has never used smokeless tobacco. She reports current alcohol use. She reports that she does not use drugs. ? ? ?  ?Maternal Diabetes: No ?Genetic Screening: Normal ?Maternal Ultrasounds/Referrals: Normal ?Fetal Ultrasounds or other Referrals:  None ?Maternal Substance Abuse:  No ?Significant Maternal Medications:  Meds include: Other: citalopram ?Significant Maternal Lab Results:  None ?Other Comments:  None ? ?Review of Systems  ?Constitutional:  Negative for fever.  ?Maternal Medical History:  ?Reason for admission: Contractions.  ? ?Fetal activity: Perceived fetal activity is normal.   ? ?Dilation: 4 ?Effacement (%): 80 ?Station: -1 ?Exam by:: Ginnie Smart RN ?Blood pressure 135/78, pulse 93, temperature 97.8 ?F (36.6 ?C), temperature source Oral, resp. rate 17, height 5\' 8"  (1.727 m), weight 96.6 kg, last menstrual period 01/12/2021, SpO2 99 %, unknown  if currently breastfeeding. ?Maternal Exam:  ?Abdomen: Fetal presentation: vertex ? ? ?Fetal Exam ?Fetal State Assessment: Category I - tracings are normal. ? ?Physical Exam ?Cardiovascular:  ?   Rate and Rhythm: Normal rate.  ?Pulmonary:  ?   Effort: Pulmonary effort is normal.  ?  ?Cx changing in MAU ?UCs q2-4 min ? ?Prenatal labs: ?ABO, Rh:   ?Antibody:   ?Rubella:   ?RPR:    ?HBsAg:    ?HIV:    ?GBS:   pending ? ?Assessment/Plan: ?31 yo  @ 37 1/7 wks ?Active labor ?Unknown GBBS ?Hx of precipitous delivery ?Admit, GBBS ordered, Ampicillin, epidural prn  ? ?3/7 II ?09/29/2021, 11:29 PM ? ? ? ? ?

## 2021-09-29 NOTE — MAU Provider Note (Signed)
S: Ms. Brooke Bailey is a 31 y.o. [phone] at [redacted]w[redacted]d  who presents to MAU today complaining of leaking of fluid since this morning. She denies vaginal bleeding. She endorses contractions. She reports normal fetal movement.   ? ?O: BP 135/78 (BP Location: Right Arm)   Pulse 93   Temp 97.8 ?F (36.6 ?C) (Oral)   Resp 17   Ht 5\' 8"  (1.727 m)   Wt 96.6 kg   LMP 01/12/2021   SpO2 99%   BMI 32.39 kg/m?  ?GENERAL: Well-developed, well-nourished female in no acute distress.  ?HEAD: Normocephalic, atraumatic.  ?CHEST: Normal effort of breathing, regular heart rate ?ABDOMEN: Soft, nontender, gravid ?PELVIC: deferred, amnisure ordered ? ?Cervical exam:  ?Dilation: 3.5 ?Effacement (%): 50 ?Cervical Position: Posterior ?Station: -2 ?Presentation: Vertex ?Exam by:: [email or web address] RN ? ? ?Fetal Monitoring: ?Baseline: 120 ?Variability: moderate ?Accelerations: 15x15 ?Decelerations: none ?Contractions: 2-4 ? ?Patient made cervical change from 3.5- 4cm in 30 minutes. Patient crying and panicked stating she had a rapid labor with her last pregnancy and the RN delivered her baby. She is very worried that this is happening again. RN instructed to call MD on call for preference for admission or further monitoring  ? ? ?A: ?[facility] at [redacted]w[redacted]d  ?Active Labor ? ?P: ?Report given to RN to contact MD on call for further instructions ? ?[redacted]w[redacted]d, CNM ?09/29/2021 11:00 PM ? ?

## 2021-09-30 ENCOUNTER — Inpatient Hospital Stay (HOSPITAL_COMMUNITY): Payer: BC Managed Care – PPO | Admitting: Anesthesiology

## 2021-09-30 ENCOUNTER — Encounter (HOSPITAL_COMMUNITY): Payer: Self-pay | Admitting: Obstetrics and Gynecology

## 2021-09-30 LAB — CBC
HCT: 32.2 % — ABNORMAL LOW (ref 36.0–46.0)
HCT: 35.4 % — ABNORMAL LOW (ref 36.0–46.0)
Hemoglobin: 11.3 g/dL — ABNORMAL LOW (ref 12.0–15.0)
Hemoglobin: 12.6 g/dL (ref 12.0–15.0)
MCH: 33.3 pg (ref 26.0–34.0)
MCH: 33.7 pg (ref 26.0–34.0)
MCHC: 35.1 g/dL (ref 30.0–36.0)
MCHC: 35.6 g/dL (ref 30.0–36.0)
MCV: 94.7 fL (ref 80.0–100.0)
MCV: 95 fL (ref 80.0–100.0)
Platelets: 141 10*3/uL — ABNORMAL LOW (ref 150–400)
Platelets: 143 10*3/uL — ABNORMAL LOW (ref 150–400)
RBC: 3.39 MIL/uL — ABNORMAL LOW (ref 3.87–5.11)
RBC: 3.74 MIL/uL — ABNORMAL LOW (ref 3.87–5.11)
RDW: 13.5 % (ref 11.5–15.5)
RDW: 13.6 % (ref 11.5–15.5)
WBC: 12 10*3/uL — ABNORMAL HIGH (ref 4.0–10.5)
WBC: 7.7 10*3/uL (ref 4.0–10.5)
nRBC: 0 % (ref 0.0–0.2)
nRBC: 0 % (ref 0.0–0.2)

## 2021-09-30 LAB — TYPE AND SCREEN
ABO/RH(D): O POS
Antibody Screen: NEGATIVE

## 2021-09-30 LAB — RPR: RPR Ser Ql: NONREACTIVE

## 2021-09-30 MED ORDER — DIBUCAINE (PERIANAL) 1 % EX OINT: 1.0000 "application " | TOPICAL_OINTMENT | CUTANEOUS | Status: DC | PRN

## 2021-09-30 MED ORDER — TETANUS-DIPHTH-ACELL PERTUSSIS 5-2.5-18.5 LF-MCG/0.5 IM SUSY: 0.5000 mL | PREFILLED_SYRINGE | Freq: Once | INTRAMUSCULAR | Status: DC

## 2021-09-30 MED ORDER — LIDOCAINE HCL (PF) 1 % IJ SOLN
INTRAMUSCULAR | Status: DC | PRN
  Administered 2021-09-30: 5 mL via EPIDURAL

## 2021-09-30 MED ORDER — IBUPROFEN 600 MG PO TABS
600.0000 mg | ORAL_TABLET | Freq: Four times a day (QID) | ORAL | Status: DC
  Administered 2021-09-30 – 2021-10-01 (×5): 600 mg via ORAL
  Filled 2021-09-30 (×5): qty 1

## 2021-09-30 MED ORDER — CEFAZOLIN SODIUM-DEXTROSE 2-4 GM/100ML-% IV SOLN
2.0000 g | Freq: Once | INTRAVENOUS | Status: AC
  Administered 2021-09-30: 2 g via INTRAVENOUS
  Filled 2021-09-30: qty 100

## 2021-09-30 MED ORDER — ZOLPIDEM TARTRATE 5 MG PO TABS: 5.0000 mg | ORAL_TABLET | Freq: Every evening | ORAL | Status: DC | PRN

## 2021-09-30 MED ORDER — PRENATAL MULTIVITAMIN CH
1.0000 | ORAL_TABLET | Freq: Every day | ORAL | Status: DC
  Administered 2021-09-30: 1 via ORAL
  Filled 2021-09-30: qty 1

## 2021-09-30 MED ORDER — ACETAMINOPHEN 325 MG PO TABS: 650.0000 mg | ORAL_TABLET | ORAL | Status: DC | PRN

## 2021-09-30 MED ORDER — COCONUT OIL OIL: 1.0000 "application " | TOPICAL_OIL | Status: DC | PRN

## 2021-09-30 MED ORDER — SENNOSIDES-DOCUSATE SODIUM 8.6-50 MG PO TABS: 2.0000 | ORAL_TABLET | ORAL | Status: DC

## 2021-09-30 MED ORDER — OXYCODONE HCL 5 MG PO TABS: 5.0000 mg | ORAL_TABLET | ORAL | Status: DC | PRN

## 2021-09-30 MED ORDER — SENNOSIDES-DOCUSATE SODIUM 8.6-50 MG PO TABS
2.0000 | ORAL_TABLET | ORAL | Status: DC
  Administered 2021-09-30: 2 via ORAL
  Filled 2021-09-30: qty 2

## 2021-09-30 MED ORDER — BENZOCAINE-MENTHOL 20-0.5 % EX AERO
1.0000 "application " | INHALATION_SPRAY | CUTANEOUS | Status: DC | PRN
  Filled 2021-09-30: qty 56

## 2021-09-30 MED ORDER — ONDANSETRON HCL 4 MG PO TABS: 4.0000 mg | ORAL_TABLET | ORAL | Status: DC | PRN

## 2021-09-30 MED ORDER — DIPHENHYDRAMINE HCL 25 MG PO CAPS: 25.0000 mg | ORAL_CAPSULE | Freq: Four times a day (QID) | ORAL | Status: DC | PRN

## 2021-09-30 MED ORDER — OXYCODONE HCL 5 MG PO TABS: 10.0000 mg | ORAL_TABLET | ORAL | Status: DC | PRN

## 2021-09-30 MED ORDER — SIMETHICONE 80 MG PO CHEW: 80.0000 mg | CHEWABLE_TABLET | ORAL | Status: DC | PRN

## 2021-09-30 MED ORDER — FENTANYL-BUPIVACAINE-NACL 0.5-0.125-0.9 MG/250ML-% EP SOLN
EPIDURAL | Status: DC | PRN
  Administered 2021-09-30: 12 mL/h via EPIDURAL

## 2021-09-30 MED ORDER — CEFAZOLIN SODIUM-DEXTROSE 2-4 GM/100ML-% IV SOLN: 2.0000 g | Freq: Three times a day (TID) | INTRAVENOUS | Status: DC

## 2021-09-30 MED ORDER — WITCH HAZEL-GLYCERIN EX PADS: 1.0000 "application " | MEDICATED_PAD | CUTANEOUS | Status: DC | PRN

## 2021-09-30 MED ORDER — ONDANSETRON HCL 4 MG/2ML IJ SOLN: 4.0000 mg | INTRAMUSCULAR | Status: DC | PRN

## 2021-09-30 NOTE — Progress Notes (Signed)
Postpartum Progress Note ? ?Post Partum Day 0 s/p spontaneous vaginal delivery.  Patient reports well-controlled pain, ambulating without difficulty, voiding spontaneously, tolerating PO.  Vaginal bleeding is appropriate. ? ? ?Objective: ?Blood pressure 132/73, pulse 68, temperature (!) 97.5 ?F (36.4 ?C), temperature source Oral, resp. rate 17, height 5\' 8"  (1.727 m), weight 96.6 kg, last menstrual period 01/12/2021, SpO2 98 %, unknown if currently breastfeeding. ? ?Physical Exam:  ?General: alert ?Lochia: appropriate ?Uterine Fundus: firm ?DVT Evaluation: No evidence of DVT seen on physical exam. ? ?Recent Labs  ?  09/29/21 ?2335 09/30/21 ?[id number]  ?HGB 12.6 11.3*  ?HCT 35.4* 32.2*  ? ? ?Assessment/Plan: ?Postpartum Day 0, s/p vaginal delivery. ?Continue routine postpartum care ?GBS was pending previously, Negative result noted from 4/13 swab  ?Lactation following ?Anticipate discharge home tomorrow ? ? LOS: 1 day  ? ?Carlyon Shadow ?09/30/2021, 7:32 AM  ? ? ?

## 2021-09-30 NOTE — Anesthesia Preprocedure Evaluation (Signed)
Anesthesia Evaluation  ?Patient identified by MRN, date of birth, ID band ?Patient awake ? ? ? ?Reviewed: ?Allergy & Precautions, NPO status , Patient's Chart, lab work & pertinent test results ? ?Airway ?Mallampati: II ? ?TM Distance: >3 FB ?Neck ROM: Full ? ? ? Dental ?no notable dental hx. ?(+) Teeth Intact, Dental Advisory Given ?  ?Pulmonary ?neg pulmonary ROS,  ?  ?Pulmonary exam normal ?breath sounds clear to auscultation ? ? ? ? ? ? Cardiovascular ?negative cardio ROS ?Normal cardiovascular exam ?Rhythm:Regular Rate:Normal ? ? ?  ?Neuro/Psych ?Anxiety negative neurological ROS ?   ? GI/Hepatic ?negative GI ROS, Neg liver ROS,   ?Endo/Other  ?negative endocrine ROS ? Renal/GU ?negative Renal ROS  ? ?  ?Musculoskeletal ? ? Abdominal ?  ?Peds ? Hematology ?Lab Results ?     Component                Value               Date                 ?     WBC                      7.7                 09/29/2021           ?     HGB                      12.6                09/29/2021           ?     HCT                      35.4 (L)            09/29/2021           ?     MCV                      94.7                09/29/2021           ?     PLT                      143 (L)             09/29/2021           ?   ?Anesthesia Other Findings ? ? Reproductive/Obstetrics ?(+) Pregnancy ? ?  ? ? ? ? ? ? ? ? ? ? ? ? ? ?  ?  ? ? ? ? ? ? ? ? ?Anesthesia Physical ?Anesthesia Plan ? ?ASA: 2 ? ?Anesthesia Plan: Epidural  ? ?Post-op Pain Management:   ? ?Induction:  ? ?PONV Risk Score and Plan:  ? ?Airway Management Planned:  ? ?Additional Equipment:  ? ?Intra-op Plan:  ? ?Post-operative Plan:  ? ?Informed Consent: I have reviewed the patients History and Physical, chart, labs and discussed the procedure including the risks, benefits and alternatives for the proposed anesthesia with the patient or authorized representative who has indicated his/her understanding and acceptance.  ? ? ? ? ? ?Plan Discussed  with:  ? ?Anesthesia Plan Comments: (37.2 wk G5P2 w gestational  ththrombocytopenia for LEA)  ? ? ? ? ? ? ?Anesthesia Quick Evaluation ? ?

## 2021-09-30 NOTE — Anesthesia Postprocedure Evaluation (Signed)
Anesthesia Post Note ? ?Patient: [name] ? ?Procedure(s) Performed: AN AD HOC LABOR EPIDURAL ? ?  ? ?Patient location during evaluation: Mother Baby ?Anesthesia Type: Epidural ?Level of consciousness: awake and alert and oriented ?Pain management: satisfactory to patient ?Vital Signs Assessment: post-procedure vital signs reviewed and stable ?Respiratory status: respiratory function stable ?Cardiovascular status: stable ?Postop Assessment: no headache, no backache, epidural receding, patient able to bend at knees, no signs of nausea or vomiting, adequate PO intake and able to ambulate ?Anesthetic complications: no ? ? ?No notable events documented. ? ?Last Vitals:  ?Vitals:  ? 09/30/21 0545 09/30/21 1100  ?BP: 132/73 125/78  ?Pulse: 68 69  ?Resp: 17 18  ?Temp: (!) 36.4 ?C 36.9 ?C  ?SpO2: 98% 99%  ?  ?Last Pain:  ?Vitals:  ? 09/30/21 1100  ?TempSrc: Oral  ?PainSc: 0-No pain  ? ?Pain Goal: Patients Stated Pain Goal: 0 (09/30/21 0041) ? ?  ?  ?  ?  ?  ?  ?  ? ?Brooke Bailey ? ? ? ? ?

## 2021-09-30 NOTE — Anesthesia Procedure Notes (Signed)
Epidural ?Patient location during procedure: OB ?Start time: 09/30/2021 12:52 AM ?End time: 09/30/2021 1:02 AM ? ?Staffing ?Anesthesiologist: Trevor Iha, MD ?Performed: anesthesiologist  ? ?Preanesthetic Checklist ?Completed: patient identified, IV checked, site marked, risks and benefits discussed, surgical consent, monitors and equipment checked, pre-op evaluation and timeout performed ? ?Epidural ?Patient position: sitting ?Prep: DuraPrep and site prepped and draped ?Patient monitoring: continuous pulse ox and blood pressure ?Approach: midline ?Location: L3-L4 ?Injection technique: LOR air ? ?Needle:  ?Needle type: Tuohy  ?Needle gauge: 17 G ?Needle length: 9 cm and 9 ?Needle insertion depth: 7 cm ?Catheter type: closed end flexible ?Catheter size: 19 Gauge ?Catheter at skin depth: 12 cm ?Test dose: negative ? ?Assessment ?Events: blood not aspirated, injection not painful, no injection resistance, no paresthesia and negative IV test ? ?Additional Notes ?Patient identified. Risks/Benefits/Options discussed with patient including but not limited to bleeding, infection, nerve damage, paralysis, failed block, incomplete pain control, headache, blood pressure changes, nausea, vomiting, reactions to medication both or allergic, itching and postpartum back pain. Confirmed with bedside nurse the patient's most recent platelet count. Confirmed with patient that they are not currently taking any anticoagulation, have any bleeding history or any family history of bleeding disorders. Patient expressed understanding and wished to proceed. All questions were answered. Sterile technique was used throughout the entire procedure. Please see nursing notes for vital signs. Test dose was given through epidural needle and negative prior to continuing to dose epidural or start infusion. Warning signs of high block given to the patient including shortness of breath, tingling/numbness in hands, complete motor block, or any  concerning symptoms with instructions to call for help. Patient was given instructions on fall risk and not to get out of bed. All questions and concerns addressed with instructions to call with any issues.  1 Attempt (S) . Patient tolerated procedure well. ? ? ? ?

## 2021-09-30 NOTE — Progress Notes (Signed)
Delivery Note ?At 1:44 AM a viable female was delivered via Vaginal, Spontaneous (Presentation: Right Occiput  ).  APGAR: 8, 9; weight  .   ?Placenta status: Pathology;Manual removal, Abnormal.  Cord: 3 vessels with the following complications: None.  Cord pH:  ? ?Placenta adherent to upper uterus>manual extraction>uterus checked and clean ? ?Anesthesia: Epidural ?Episiotomy:   ?Lacerations:  second degree midline lac repaired ?Suture Repair: 2.0 vicryl rapide ?Est. Blood Loss (mL):  300 ? ?Ancef 2 gm IVPB x 1 ? ?Mom to postpartum.  Baby to Couplet care / Skin to Skin. ? ?Roselle Locus II ?09/30/2021, 2:00 AM ? ?

## 2021-09-30 NOTE — MAU Note (Signed)
Monitors off for transfer to LD 

## 2021-09-30 NOTE — MAU Note (Signed)
Pt reports that her contractions are closer and stronger.SVE. ?

## 2021-10-01 LAB — SURGICAL PATHOLOGY

## 2021-10-01 NOTE — Social Work (Signed)
MOB was referred for history of depression/anxiety. ? ?* Referral screened out by Clinical Social Worker because none of the following criteria appear to apply: ?~ History of anxiety/depression during this pregnancy, or of post-partum depression following prior delivery. ?~ Diagnosis of anxiety and/or depression within last 3 years ?OR ?* MOB's symptoms currently being treated with medication and/or therapy. Per chart review, MOB currently treats anxiety and depression symptoms with Celexa 20mg. MOB completed [place] w/ score-0.  ? ?Please contact the Clinical Social Worker if needs arise, by MOB request, or if MOB scores greater than 9/yes to question 10 on Edinburgh Postpartum Depression Screen.  ? ?Brooke Bailey, MSW, LCSW ?[facility]  ?Clinical Social Worker  ?[phone] ?10/01/2021  8:32 AM  ?

## 2021-10-01 NOTE — Discharge Summary (Signed)
? ?  Postpartum Discharge Summary ? ? ? ?   ?Patient Name: Brooke Bailey ?DOB: 08-04-1990 ?MRN: [id number] ? ?Date of admission: 09/29/2021 ?Delivery date:09/30/2021  ?Delivering provider: Everlene Farrier  ?Date of discharge: 10/01/2021 ? ?Admitting diagnosis: Term pregnancy [Z34.90] ?Intrauterine pregnancy: [redacted]w[redacted]d    ?Secondary diagnosis:  Principal Problem: ?  Term pregnancy ? ?Additional problems:      ?Discharge diagnosis: Term Pregnancy Delivered                                              ?Post partum procedures:   ?Augmentation: N/A ?Complications: None ? ?Hospital course: Onset of Labor With Vaginal Delivery      ?31y.o. yo [id number] 344w2das admitted in Active Labor on 09/29/2021. Patient had an uncomplicated labor course as follows:  ?Membrane Rupture Time/Date: 1:10 AM ,09/30/2021   ?Delivery Method:Vaginal, Spontaneous  ?Episiotomy: None  ?Lacerations:  2nd degree  ?Patient had an uncomplicated postpartum course.  She is ambulating, tolerating a regular diet, passing flatus, and urinating well. Patient is discharged home in stable condition on 10/01/21. ? ?Newborn Data: ?Birth date:09/30/2021  ?Birth time:1:44 AM  ?Gender:Female  ?Living status:Living  ?Apgars:8 ,9  ?Weight:2940 g  ? ?Magnesium Sulfate received: No ?BMZ received: No ?Rhophylac:N/A ?MMR:N/A ?T-DaP:Given prenatally ?Flu: N/A ?Transfusion:No ? ?Physical exam  ?Vitals:  ? 09/30/21 1100 09/30/21 1500 09/30/21 2053 10/01/21 0545  ?BP: 125/78 123/67 120/78 119/68  ?Pulse: 69 73 84 84  ?Resp: 18 18 18 18   ?Temp: 98.4 ?F (36.9 ?C) 98 ?F (36.7 ?C) 98.3 ?F (36.8 ?C) 98 ?F (36.7 ?C)  ?TempSrc: Oral Oral Axillary Oral  ?SpO2: 99% 98%  99%  ?Weight:      ?Height:      ? ?General: alert, cooperative, and no distress ?Lochia: appropriate ?Uterine Fundus: firm ?Incision: Healing well with no significant drainage ?DVT Evaluation: No evidence of DVT seen on physical exam. ?Labs: ?Lab Results  ?Component Value Date  ? WBC 12.0 (H) 09/30/2021  ? HGB 11.3 (L)  09/30/2021  ? HCT 32.2 (L) 09/30/2021  ? MCV 95.0 09/30/2021  ? PLT 141 (L) 09/30/2021  ? ?   ? View : No data to display.  ?  ?  ?  ? ?Edinburgh Score: ? ?  10/01/2021  ?  5:31 AM  ?EdFlavia Shipperostnatal Depression Scale Screening Tool  ?I have been able to laugh and see the funny side of things. 0  ?I have looked forward with enjoyment to things. 0  ?I have blamed myself unnecessarily when things went wrong. 0  ?I have been anxious or worried for no good reason. 0  ?I have felt scared or panicky for no good reason. 0  ?Things have been getting on top of me. 0  ?I have been so unhappy that I have had difficulty sleeping. 0  ?I have felt sad or miserable. 0  ?I have been so unhappy that I have been crying. 0  ?The thought of harming myself has occurred to me. 0  ?Edinburgh Postnatal Depression Scale Total 0  ? ? ? ? ?After visit meds:  ?Allergies as of 10/01/2021   ?No Known Allergies ?  ? ?  ?Medication List  ?  ? ?TAKE these medications   ? ?citalopram 20 MG tablet ?Commonly known as: CELEXA ?Take 20 mg by mouth daily. ?  ?folic  acid 1 MG tablet ?Commonly known as: FOLVITE ?Take 4 mg by mouth daily. ?  ?prenatal multivitamin Tabs tablet ?Take 1 tablet by mouth daily at 12 noon. ?  ? ?  ? ? ? ?Discharge home in stable condition ?Infant Feeding: Breast ?Infant Disposition:home with mother ?Discharge instruction: per After Visit Summary and Postpartum booklet. ?Activity: Advance as tolerated. Pelvic rest for 6 weeks.  ?Diet: routine diet ?Anticipated Birth Control: Unsure ?Postpartum Appointment:6 weeks ?Additional Postpartum F/U:    ?Future Appointments:No future appointments. ?Follow up Visit: ? ? ?  ? ?10/01/2021 ?Luz Lex, MD ? ? ?

## 2021-10-10 ENCOUNTER — Telehealth (HOSPITAL_COMMUNITY): Payer: Self-pay | Admitting: *Deleted

## 2021-10-10 NOTE — Telephone Encounter (Signed)
Left phone voicemail message. ? ?Duffy Rhody, RN 10-10-2021 at 1:10pm ?

## 2022-05-24 IMAGING — US US OB COMP LESS 14 WK
1 series · 13 of 28 positions shown · non-contrast
Comparison: none

[Series 1: us ob comp less 14 wk · 39 acquisitions, 13 frames shown]
[im 2/39]
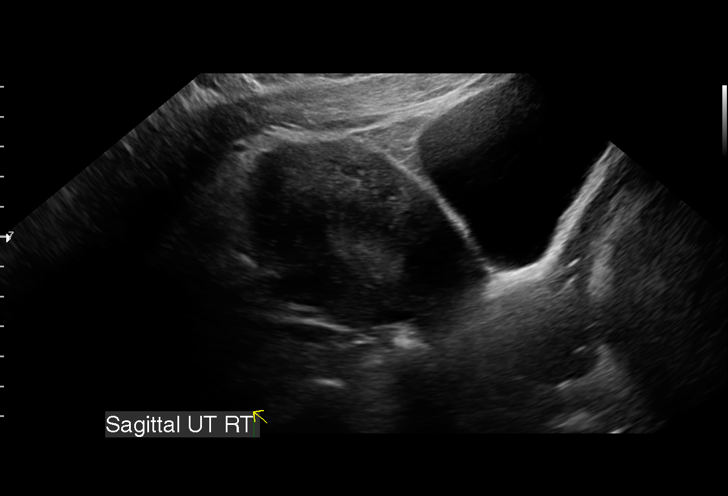
[im 5/39]
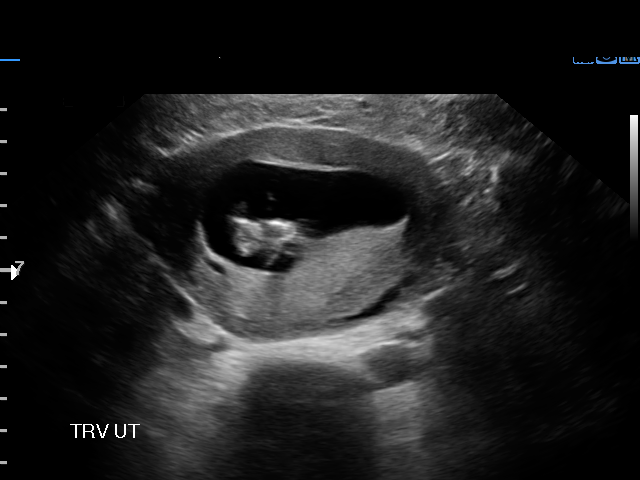
[im 8/39]
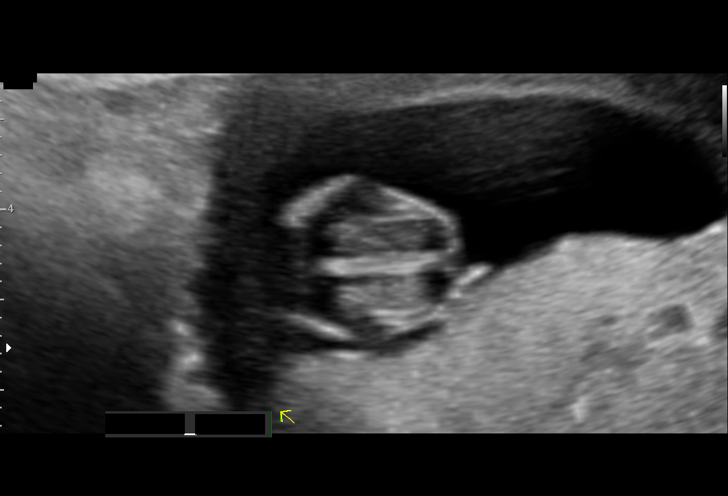
[im 10/39]
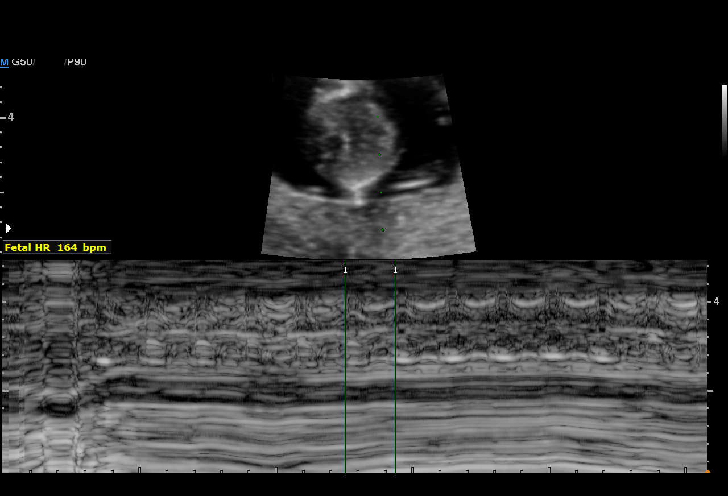
[im 13/39]
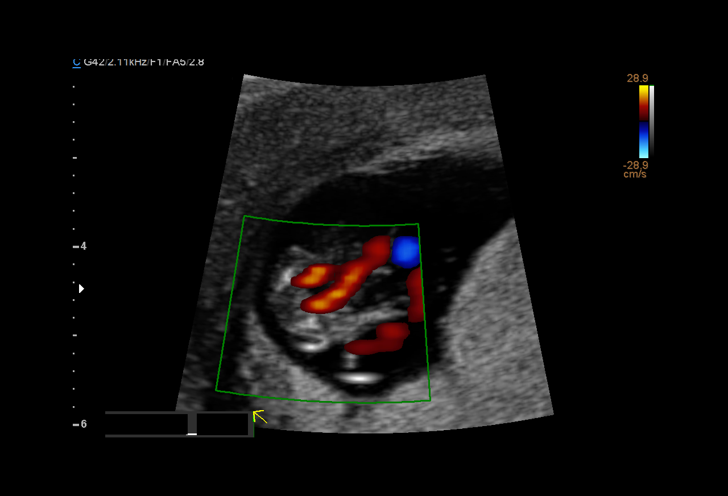
[im 16/39]
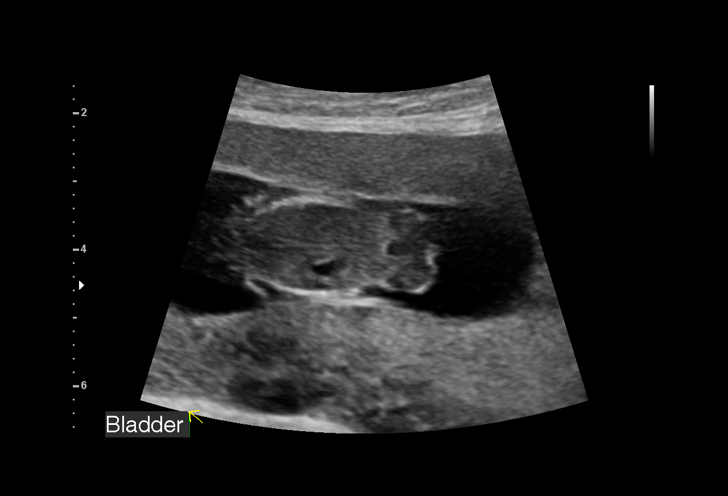
[im 20/39]
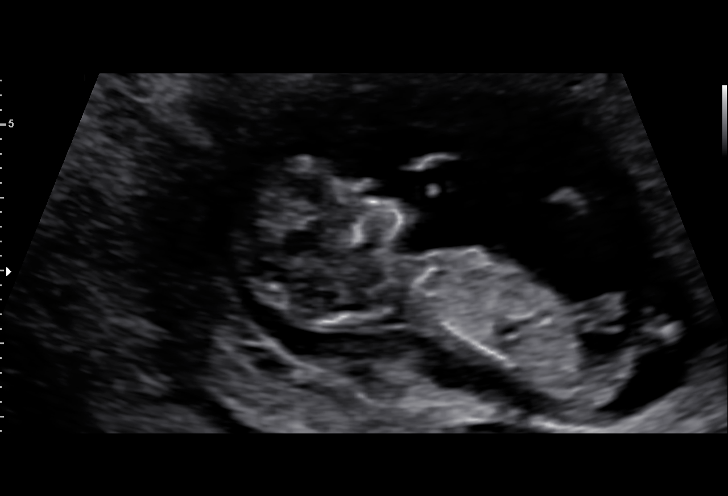
[im 23/39]
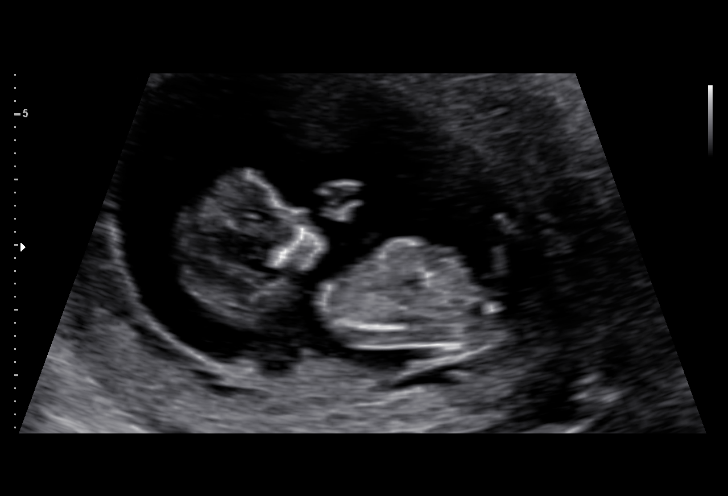
[im 26/39]
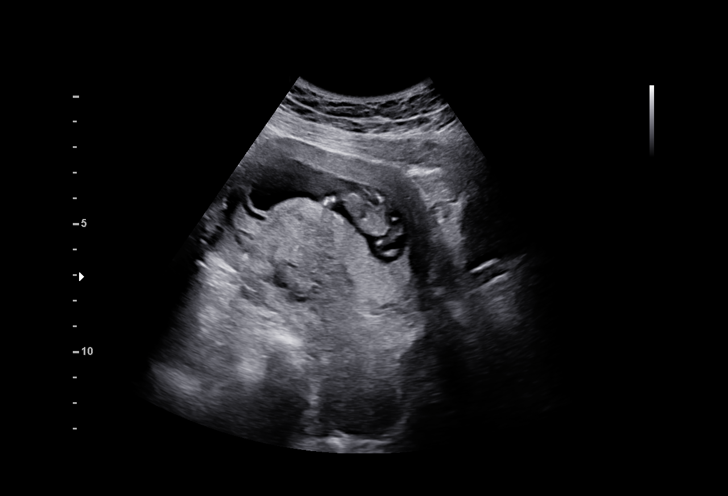
[im 29/39]
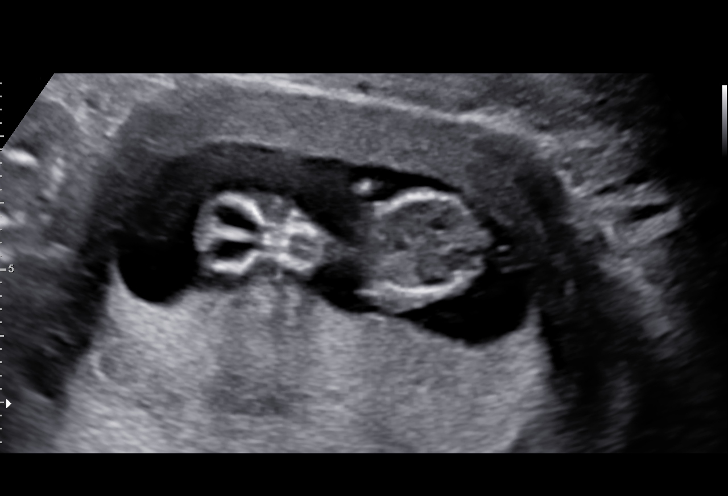
[im 31/39]
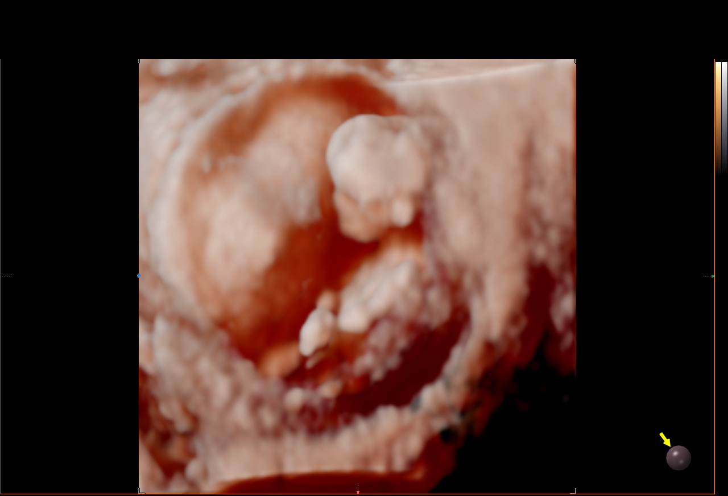
[im 34/39]
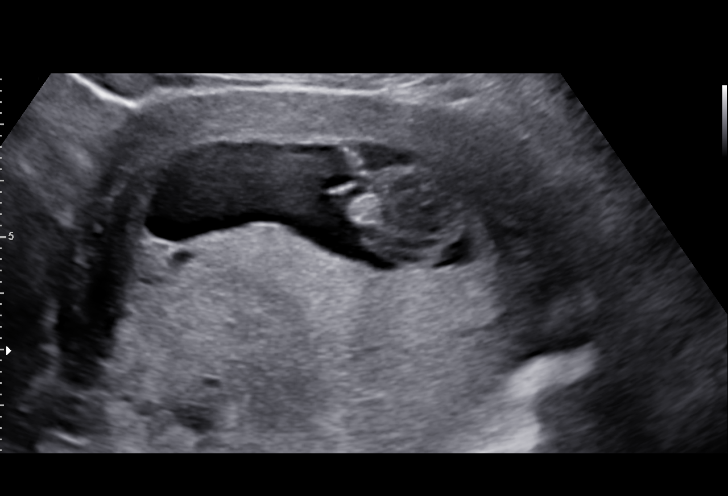
[im 37/39]
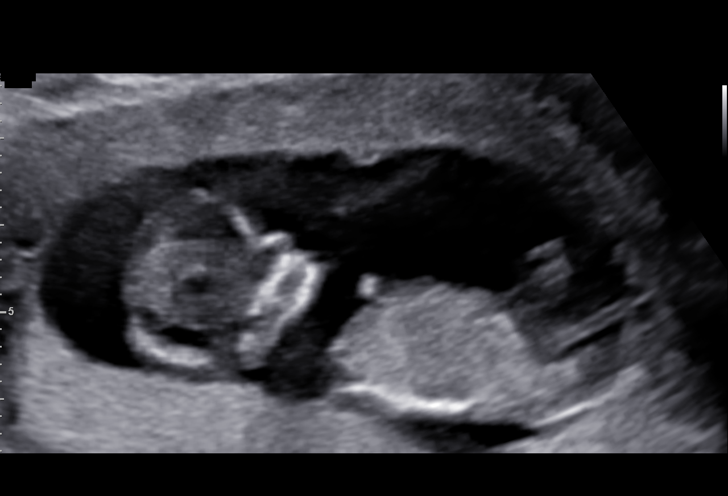

[13 of 28 positions shown; findings below may reference images not displayed]

[REDACTED]. [HOSPITAL]
                   DO

    ANATOMY

Indications

 Encounter for assessment of fetal demise
 (triplet pregnancy with 2 fetuses demised)
 Family history of congenital anomaly
 (previous child with cleft palate)
 12 weeks gestation of pregnancy
Fetal Evaluation

 Num Of Fetuses:         1
 Fetal Heart Rate(bpm):  164
 Cardiac Activity:       Observed

 Amniotic Fluid
 AFI FV:      Within normal limits
Biometry

 CRL:      53.7  mm     G. Age:  11w 6d                  EDD:   10/22/21
OB History

 Gravidity:    4         Term:   2         SAB:   1
Gestational Age

 LMP:           12w 2d        Date:  01/12/21                 EDD:   10/19/21
 Best:          12w 2d     Det. By:  LMP  (01/12/21)          EDD:   10/19/21
Anatomy

 Cranium:               Visualized             Abdominal Wall:         Visualized
 Choroid Plexus:        Visualized             Kidneys:                Visualized
 Thoracic:              Visualized             Bladder:                Visualized
 Heart:                 Visualized             Upper Extremities:      Visualized
 Stomach:               Visualized             Lower Extremities:      Visualized

 Other:  Limited due to early gestational age.
Cervix Uterus Adnexa

 Cervix
 Normal appearance by transabdominal scan.

 Uterus
 No abnormality visualized.

 Right Ovary
 Within normal limits.

 Left Ovary
 Not visualized.

 Adnexa
 No adnexal mass visualized.
Comments

 Omega Hepburn was seen for a first trimester ultrasound as
 a possible triplet gestation with two vanishing fetuses was
 noted on an early ultrasound performed in your office.  She
 denies any problems in her current pregnancy and denies
 any significant past medical history.

 The patient reports that she had to take Clomid to conceive
 her two other living children.  However, this was a
 spontaneously conceived pregnancy without any medications.

 She reports a significant family history of cleft palate without
 a cleft lip.  Her second daughter was born with a cleft palate
 without a cleft lip.  Her father also had a cleft palate.

 The crown-rump length measured today is consistent with her
 gestational age, giving her an EDC October 19, 2021.

 The nuchal translucency measured 1.4 mm, which is within
 normal limits.  There was also a nasal bone present in the
 fetus.  The patient was reassured that these two findings
 indicate that her risk of having a fetus with Down syndrome is
 low.

 A viable singleton gestation was noted on today's ultrasound
 exam.  The possible remnants of the other two gestational
 sacs that were noted in your office were not visualized today.

 The patient showed me an image of the ultrasound that was
 obtained in your office.  The two other cystic structures were
 located within the uterine wall, possibly in the lower uterine
 segment.  These two cystic structures were not a part of the
 gestational sac of the viable fetus.

 The patient was advised that based on the appearance of the
 image, I highly doubt that these two structures were the
 remnants of sacs from two other fetuses as they were not
 connected to the sac of the viable fetus.  If these two cystic
 structures were located in the lower uterine segment, they
 could have been nabothian cysts in the cervix.

 As the patient would really like to have a cell free DNA test
 drawn to screen for fetal chromosomal abnormalities, she
 was advised that she can have the test drawn in your office
 later this afternoon as I believe that this is most likely a
 singleton gestation.

 She can be reassured if the cell free DNA test indicates a low
 risk.  However, if the cell free DNA test shows any
 abnormalities that is consistent with a multiple gestation, we
 will speak to her about an amniocentesis or just rely on the
 ultrasound images for assessment of fetal chromosomal
 abnormalities.  The patient is comfortable with this plan.

 A detailed fetal anatomy scan was scheduled in our office at
 around 19 weeks.  We will assess the fetal palate during that
 exam.

 The patient stated that all of her questions were answered.

 A total of 30 minutes was spent counseling and coordinating
 the care for this patient.  Greater than 50% of the time was
 spent in direct face-to-face contact.
